# Patient Record
Sex: Male | Born: 1950 | Race: White | Hispanic: No | Marital: Married | Smoking: Never smoker
Health system: Southern US, Community
[De-identification: ages and names within clinical notes are randomized; demographics above are authoritative.]

## PROBLEM LIST (undated history)

## (undated) DIAGNOSIS — R011 Cardiac murmur, unspecified: Secondary | ICD-10-CM

## (undated) DIAGNOSIS — N400 Enlarged prostate without lower urinary tract symptoms: Secondary | ICD-10-CM

## (undated) DIAGNOSIS — R109 Unspecified abdominal pain: Secondary | ICD-10-CM

## (undated) DIAGNOSIS — D510 Vitamin B12 deficiency anemia due to intrinsic factor deficiency: Secondary | ICD-10-CM

## (undated) DIAGNOSIS — I10 Essential (primary) hypertension: Secondary | ICD-10-CM

## (undated) DIAGNOSIS — E119 Type 2 diabetes mellitus without complications: Secondary | ICD-10-CM

## (undated) DIAGNOSIS — R630 Anorexia: Secondary | ICD-10-CM

## (undated) DIAGNOSIS — E7800 Pure hypercholesterolemia, unspecified: Secondary | ICD-10-CM

## (undated) DIAGNOSIS — M1990 Unspecified osteoarthritis, unspecified site: Secondary | ICD-10-CM

## (undated) DIAGNOSIS — R110 Nausea: Secondary | ICD-10-CM

## (undated) DIAGNOSIS — N183 Chronic kidney disease, stage 3 unspecified: Secondary | ICD-10-CM

## (undated) DIAGNOSIS — N200 Calculus of kidney: Secondary | ICD-10-CM

## (undated) DIAGNOSIS — Z87442 Personal history of urinary calculi: Secondary | ICD-10-CM

## (undated) HISTORY — PX: WISDOM TOOTH EXTRACTION: SHX21

## (undated) HISTORY — PX: CYSTOSCOPY W/ URETEROSCOPY W/ LITHOTRIPSY: SUR380

## (undated) HISTORY — DX: Pure hypercholesterolemia, unspecified: E78.00

## (undated) HISTORY — DX: Chronic kidney disease, stage 3 unspecified: N18.30

## (undated) HISTORY — DX: Essential (primary) hypertension: I10

## (undated) HISTORY — DX: Unspecified abdominal pain: R10.9

## (undated) HISTORY — DX: Unspecified osteoarthritis, unspecified site: M19.90

## (undated) HISTORY — DX: Nausea: R11.0

## (undated) HISTORY — DX: Calculus of kidney: N20.0

## (undated) HISTORY — DX: Benign prostatic hyperplasia without lower urinary tract symptoms: N40.0

## (undated) HISTORY — DX: Vitamin B12 deficiency anemia due to intrinsic factor deficiency: D51.0

## (undated) HISTORY — DX: Anorexia: R63.0

## (undated) HISTORY — DX: Type 2 diabetes mellitus without complications: E11.9

## (undated) HISTORY — PX: COLONOSCOPY: SHX174

---

## 2009-10-28 ENCOUNTER — Encounter: Admission: RE | Admit: 2009-10-28 | Discharge: 2009-10-28 | Payer: Self-pay | Admitting: Specialist

## 2013-07-21 DIAGNOSIS — N200 Calculus of kidney: Secondary | ICD-10-CM

## 2013-07-21 HISTORY — DX: Calculus of kidney: N20.0

## 2014-05-25 DIAGNOSIS — H35353 Cystoid macular degeneration, bilateral: Secondary | ICD-10-CM | POA: Diagnosis not present

## 2014-05-25 DIAGNOSIS — E1139 Type 2 diabetes mellitus with other diabetic ophthalmic complication: Secondary | ICD-10-CM | POA: Diagnosis not present

## 2014-06-25 DIAGNOSIS — Z0000 Encounter for general adult medical examination without abnormal findings: Secondary | ICD-10-CM | POA: Diagnosis not present

## 2014-06-25 DIAGNOSIS — Z6828 Body mass index (BMI) 28.0-28.9, adult: Secondary | ICD-10-CM | POA: Diagnosis not present

## 2014-10-06 DIAGNOSIS — E08311 Diabetes mellitus due to underlying condition with unspecified diabetic retinopathy with macular edema: Secondary | ICD-10-CM | POA: Diagnosis not present

## 2014-10-21 DIAGNOSIS — E119 Type 2 diabetes mellitus without complications: Secondary | ICD-10-CM | POA: Diagnosis not present

## 2014-10-21 DIAGNOSIS — E1165 Type 2 diabetes mellitus with hyperglycemia: Secondary | ICD-10-CM | POA: Diagnosis not present

## 2014-10-21 DIAGNOSIS — I10 Essential (primary) hypertension: Secondary | ICD-10-CM | POA: Diagnosis not present

## 2014-10-21 DIAGNOSIS — Z6828 Body mass index (BMI) 28.0-28.9, adult: Secondary | ICD-10-CM | POA: Diagnosis not present

## 2014-10-21 DIAGNOSIS — Z125 Encounter for screening for malignant neoplasm of prostate: Secondary | ICD-10-CM | POA: Diagnosis not present

## 2014-12-02 DIAGNOSIS — E113313 Type 2 diabetes mellitus with moderate nonproliferative diabetic retinopathy with macular edema, bilateral: Secondary | ICD-10-CM | POA: Diagnosis not present

## 2014-12-16 DIAGNOSIS — E113313 Type 2 diabetes mellitus with moderate nonproliferative diabetic retinopathy with macular edema, bilateral: Secondary | ICD-10-CM | POA: Diagnosis not present

## 2015-01-06 DIAGNOSIS — E113311 Type 2 diabetes mellitus with moderate nonproliferative diabetic retinopathy with macular edema, right eye: Secondary | ICD-10-CM | POA: Diagnosis not present

## 2015-01-18 DIAGNOSIS — E1165 Type 2 diabetes mellitus with hyperglycemia: Secondary | ICD-10-CM | POA: Diagnosis not present

## 2015-01-18 DIAGNOSIS — Z6828 Body mass index (BMI) 28.0-28.9, adult: Secondary | ICD-10-CM | POA: Diagnosis not present

## 2015-01-18 DIAGNOSIS — I10 Essential (primary) hypertension: Secondary | ICD-10-CM | POA: Diagnosis not present

## 2015-01-20 DIAGNOSIS — E113312 Type 2 diabetes mellitus with moderate nonproliferative diabetic retinopathy with macular edema, left eye: Secondary | ICD-10-CM | POA: Diagnosis not present

## 2015-02-17 DIAGNOSIS — E113311 Type 2 diabetes mellitus with moderate nonproliferative diabetic retinopathy with macular edema, right eye: Secondary | ICD-10-CM | POA: Diagnosis not present

## 2015-03-03 DIAGNOSIS — E113312 Type 2 diabetes mellitus with moderate nonproliferative diabetic retinopathy with macular edema, left eye: Secondary | ICD-10-CM | POA: Diagnosis not present

## 2015-04-07 DIAGNOSIS — E113311 Type 2 diabetes mellitus with moderate nonproliferative diabetic retinopathy with macular edema, right eye: Secondary | ICD-10-CM | POA: Diagnosis not present

## 2015-05-03 DIAGNOSIS — I10 Essential (primary) hypertension: Secondary | ICD-10-CM | POA: Diagnosis not present

## 2015-05-03 DIAGNOSIS — E785 Hyperlipidemia, unspecified: Secondary | ICD-10-CM | POA: Diagnosis not present

## 2015-05-03 DIAGNOSIS — E119 Type 2 diabetes mellitus without complications: Secondary | ICD-10-CM | POA: Diagnosis not present

## 2015-05-03 DIAGNOSIS — Z6829 Body mass index (BMI) 29.0-29.9, adult: Secondary | ICD-10-CM | POA: Diagnosis not present

## 2015-05-19 DIAGNOSIS — E113311 Type 2 diabetes mellitus with moderate nonproliferative diabetic retinopathy with macular edema, right eye: Secondary | ICD-10-CM | POA: Diagnosis not present

## 2015-06-09 DIAGNOSIS — E113311 Type 2 diabetes mellitus with moderate nonproliferative diabetic retinopathy with macular edema, right eye: Secondary | ICD-10-CM | POA: Diagnosis not present

## 2015-07-07 DIAGNOSIS — E113311 Type 2 diabetes mellitus with moderate nonproliferative diabetic retinopathy with macular edema, right eye: Secondary | ICD-10-CM | POA: Diagnosis not present

## 2015-08-10 DIAGNOSIS — T63481A Toxic effect of venom of other arthropod, accidental (unintentional), initial encounter: Secondary | ICD-10-CM | POA: Diagnosis not present

## 2015-08-11 DIAGNOSIS — E113311 Type 2 diabetes mellitus with moderate nonproliferative diabetic retinopathy with macular edema, right eye: Secondary | ICD-10-CM | POA: Diagnosis not present

## 2015-08-31 DIAGNOSIS — E119 Type 2 diabetes mellitus without complications: Secondary | ICD-10-CM | POA: Diagnosis not present

## 2015-08-31 DIAGNOSIS — Z6828 Body mass index (BMI) 28.0-28.9, adult: Secondary | ICD-10-CM | POA: Diagnosis not present

## 2015-08-31 DIAGNOSIS — E11319 Type 2 diabetes mellitus with unspecified diabetic retinopathy without macular edema: Secondary | ICD-10-CM | POA: Diagnosis not present

## 2015-08-31 DIAGNOSIS — E785 Hyperlipidemia, unspecified: Secondary | ICD-10-CM | POA: Diagnosis not present

## 2015-08-31 DIAGNOSIS — I10 Essential (primary) hypertension: Secondary | ICD-10-CM | POA: Diagnosis not present

## 2015-08-31 DIAGNOSIS — Z125 Encounter for screening for malignant neoplasm of prostate: Secondary | ICD-10-CM | POA: Diagnosis not present

## 2015-09-22 DIAGNOSIS — E113311 Type 2 diabetes mellitus with moderate nonproliferative diabetic retinopathy with macular edema, right eye: Secondary | ICD-10-CM | POA: Diagnosis not present

## 2015-10-13 DIAGNOSIS — Z6828 Body mass index (BMI) 28.0-28.9, adult: Secondary | ICD-10-CM | POA: Diagnosis not present

## 2015-10-13 DIAGNOSIS — J019 Acute sinusitis, unspecified: Secondary | ICD-10-CM | POA: Diagnosis not present

## 2015-10-20 DIAGNOSIS — E113311 Type 2 diabetes mellitus with moderate nonproliferative diabetic retinopathy with macular edema, right eye: Secondary | ICD-10-CM | POA: Diagnosis not present

## 2015-11-03 DIAGNOSIS — Z6828 Body mass index (BMI) 28.0-28.9, adult: Secondary | ICD-10-CM | POA: Diagnosis not present

## 2015-11-03 DIAGNOSIS — T63481A Toxic effect of venom of other arthropod, accidental (unintentional), initial encounter: Secondary | ICD-10-CM | POA: Diagnosis not present

## 2015-11-17 DIAGNOSIS — E113393 Type 2 diabetes mellitus with moderate nonproliferative diabetic retinopathy without macular edema, bilateral: Secondary | ICD-10-CM | POA: Diagnosis not present

## 2015-11-28 DIAGNOSIS — B349 Viral infection, unspecified: Secondary | ICD-10-CM | POA: Diagnosis not present

## 2015-11-28 DIAGNOSIS — Z6827 Body mass index (BMI) 27.0-27.9, adult: Secondary | ICD-10-CM | POA: Diagnosis not present

## 2015-12-01 DIAGNOSIS — B349 Viral infection, unspecified: Secondary | ICD-10-CM | POA: Diagnosis not present

## 2015-12-01 DIAGNOSIS — Z6827 Body mass index (BMI) 27.0-27.9, adult: Secondary | ICD-10-CM | POA: Diagnosis not present

## 2016-01-10 DIAGNOSIS — E785 Hyperlipidemia, unspecified: Secondary | ICD-10-CM | POA: Diagnosis not present

## 2016-01-10 DIAGNOSIS — Z6827 Body mass index (BMI) 27.0-27.9, adult: Secondary | ICD-10-CM | POA: Diagnosis not present

## 2016-01-10 DIAGNOSIS — I10 Essential (primary) hypertension: Secondary | ICD-10-CM | POA: Diagnosis not present

## 2016-01-10 DIAGNOSIS — E1165 Type 2 diabetes mellitus with hyperglycemia: Secondary | ICD-10-CM | POA: Diagnosis not present

## 2016-01-12 DIAGNOSIS — E113393 Type 2 diabetes mellitus with moderate nonproliferative diabetic retinopathy without macular edema, bilateral: Secondary | ICD-10-CM | POA: Diagnosis not present

## 2016-03-09 DIAGNOSIS — E119 Type 2 diabetes mellitus without complications: Secondary | ICD-10-CM | POA: Diagnosis not present

## 2016-03-09 DIAGNOSIS — H25811 Combined forms of age-related cataract, right eye: Secondary | ICD-10-CM | POA: Diagnosis not present

## 2016-04-11 DIAGNOSIS — Z125 Encounter for screening for malignant neoplasm of prostate: Secondary | ICD-10-CM | POA: Diagnosis not present

## 2016-04-11 DIAGNOSIS — E1165 Type 2 diabetes mellitus with hyperglycemia: Secondary | ICD-10-CM | POA: Diagnosis not present

## 2016-04-11 DIAGNOSIS — E119 Type 2 diabetes mellitus without complications: Secondary | ICD-10-CM | POA: Diagnosis not present

## 2016-04-11 DIAGNOSIS — E785 Hyperlipidemia, unspecified: Secondary | ICD-10-CM | POA: Diagnosis not present

## 2016-04-11 DIAGNOSIS — E11319 Type 2 diabetes mellitus with unspecified diabetic retinopathy without macular edema: Secondary | ICD-10-CM | POA: Diagnosis not present

## 2016-04-11 DIAGNOSIS — I10 Essential (primary) hypertension: Secondary | ICD-10-CM | POA: Diagnosis not present

## 2016-04-12 DIAGNOSIS — L821 Other seborrheic keratosis: Secondary | ICD-10-CM | POA: Diagnosis not present

## 2016-04-12 DIAGNOSIS — C44622 Squamous cell carcinoma of skin of right upper limb, including shoulder: Secondary | ICD-10-CM | POA: Diagnosis not present

## 2016-04-12 DIAGNOSIS — D1801 Hemangioma of skin and subcutaneous tissue: Secondary | ICD-10-CM | POA: Diagnosis not present

## 2016-04-12 DIAGNOSIS — L578 Other skin changes due to chronic exposure to nonionizing radiation: Secondary | ICD-10-CM | POA: Diagnosis not present

## 2016-04-12 DIAGNOSIS — D225 Melanocytic nevi of trunk: Secondary | ICD-10-CM | POA: Diagnosis not present

## 2016-04-12 DIAGNOSIS — L570 Actinic keratosis: Secondary | ICD-10-CM | POA: Diagnosis not present

## 2016-05-08 DIAGNOSIS — E119 Type 2 diabetes mellitus without complications: Secondary | ICD-10-CM | POA: Diagnosis not present

## 2016-05-15 DIAGNOSIS — Z79899 Other long term (current) drug therapy: Secondary | ICD-10-CM | POA: Diagnosis not present

## 2016-05-15 DIAGNOSIS — I10 Essential (primary) hypertension: Secondary | ICD-10-CM | POA: Diagnosis not present

## 2016-05-15 DIAGNOSIS — E119 Type 2 diabetes mellitus without complications: Secondary | ICD-10-CM | POA: Diagnosis not present

## 2016-05-15 DIAGNOSIS — Z7984 Long term (current) use of oral hypoglycemic drugs: Secondary | ICD-10-CM | POA: Diagnosis not present

## 2016-05-15 DIAGNOSIS — H25811 Combined forms of age-related cataract, right eye: Secondary | ICD-10-CM | POA: Diagnosis not present

## 2016-05-15 HISTORY — PX: CATARACT EXTRACTION: SUR2

## 2016-06-20 DIAGNOSIS — H35351 Cystoid macular degeneration, right eye: Secondary | ICD-10-CM | POA: Diagnosis not present

## 2016-06-26 DIAGNOSIS — E113311 Type 2 diabetes mellitus with moderate nonproliferative diabetic retinopathy with macular edema, right eye: Secondary | ICD-10-CM | POA: Diagnosis not present

## 2016-07-12 DIAGNOSIS — E113393 Type 2 diabetes mellitus with moderate nonproliferative diabetic retinopathy without macular edema, bilateral: Secondary | ICD-10-CM | POA: Diagnosis not present

## 2016-08-28 DIAGNOSIS — L570 Actinic keratosis: Secondary | ICD-10-CM | POA: Diagnosis not present

## 2016-08-28 DIAGNOSIS — B009 Herpesviral infection, unspecified: Secondary | ICD-10-CM | POA: Diagnosis not present

## 2016-08-30 DIAGNOSIS — H35351 Cystoid macular degeneration, right eye: Secondary | ICD-10-CM | POA: Diagnosis not present

## 2016-09-06 DIAGNOSIS — E785 Hyperlipidemia, unspecified: Secondary | ICD-10-CM | POA: Diagnosis not present

## 2016-09-06 DIAGNOSIS — I10 Essential (primary) hypertension: Secondary | ICD-10-CM | POA: Diagnosis not present

## 2016-09-06 DIAGNOSIS — Z6828 Body mass index (BMI) 28.0-28.9, adult: Secondary | ICD-10-CM | POA: Diagnosis not present

## 2016-09-06 DIAGNOSIS — E119 Type 2 diabetes mellitus without complications: Secondary | ICD-10-CM | POA: Diagnosis not present

## 2016-10-04 DIAGNOSIS — H35351 Cystoid macular degeneration, right eye: Secondary | ICD-10-CM | POA: Diagnosis not present

## 2016-10-16 DIAGNOSIS — Z1211 Encounter for screening for malignant neoplasm of colon: Secondary | ICD-10-CM | POA: Diagnosis not present

## 2016-11-07 DIAGNOSIS — K621 Rectal polyp: Secondary | ICD-10-CM | POA: Diagnosis not present

## 2016-11-07 DIAGNOSIS — K5730 Diverticulosis of large intestine without perforation or abscess without bleeding: Secondary | ICD-10-CM | POA: Diagnosis not present

## 2016-11-07 DIAGNOSIS — K648 Other hemorrhoids: Secondary | ICD-10-CM | POA: Diagnosis not present

## 2016-11-07 DIAGNOSIS — E7800 Pure hypercholesterolemia, unspecified: Secondary | ICD-10-CM | POA: Diagnosis not present

## 2016-11-07 DIAGNOSIS — Z79899 Other long term (current) drug therapy: Secondary | ICD-10-CM | POA: Diagnosis not present

## 2016-11-07 DIAGNOSIS — D127 Benign neoplasm of rectosigmoid junction: Secondary | ICD-10-CM | POA: Diagnosis not present

## 2016-11-07 DIAGNOSIS — E119 Type 2 diabetes mellitus without complications: Secondary | ICD-10-CM | POA: Diagnosis not present

## 2016-11-07 DIAGNOSIS — D122 Benign neoplasm of ascending colon: Secondary | ICD-10-CM | POA: Diagnosis not present

## 2016-11-07 DIAGNOSIS — Z86010 Personal history of colonic polyps: Secondary | ICD-10-CM | POA: Diagnosis not present

## 2016-11-07 DIAGNOSIS — Z1211 Encounter for screening for malignant neoplasm of colon: Secondary | ICD-10-CM | POA: Diagnosis not present

## 2016-11-15 DIAGNOSIS — E113311 Type 2 diabetes mellitus with moderate nonproliferative diabetic retinopathy with macular edema, right eye: Secondary | ICD-10-CM | POA: Diagnosis not present

## 2016-12-20 DIAGNOSIS — E785 Hyperlipidemia, unspecified: Secondary | ICD-10-CM | POA: Diagnosis not present

## 2016-12-20 DIAGNOSIS — I10 Essential (primary) hypertension: Secondary | ICD-10-CM | POA: Diagnosis not present

## 2016-12-20 DIAGNOSIS — Z6827 Body mass index (BMI) 27.0-27.9, adult: Secondary | ICD-10-CM | POA: Diagnosis not present

## 2016-12-20 DIAGNOSIS — E119 Type 2 diabetes mellitus without complications: Secondary | ICD-10-CM | POA: Diagnosis not present

## 2017-01-03 DIAGNOSIS — E113311 Type 2 diabetes mellitus with moderate nonproliferative diabetic retinopathy with macular edema, right eye: Secondary | ICD-10-CM | POA: Diagnosis not present

## 2020-12-08 ENCOUNTER — Encounter: Payer: Self-pay | Admitting: Gastroenterology

## 2021-01-09 ENCOUNTER — Other Ambulatory Visit (INDEPENDENT_AMBULATORY_CARE_PROVIDER_SITE_OTHER): Payer: Medicare Other

## 2021-01-09 ENCOUNTER — Encounter: Payer: Self-pay | Admitting: Gastroenterology

## 2021-01-09 ENCOUNTER — Other Ambulatory Visit: Payer: Self-pay

## 2021-01-09 ENCOUNTER — Ambulatory Visit (INDEPENDENT_AMBULATORY_CARE_PROVIDER_SITE_OTHER): Payer: Medicare Other | Admitting: Gastroenterology

## 2021-01-09 VITALS — BP 144/82 | HR 98 | Ht 64.0 in | Wt 156.0 lb

## 2021-01-09 DIAGNOSIS — Z860100 Personal history of colon polyps, unspecified: Secondary | ICD-10-CM

## 2021-01-09 DIAGNOSIS — K760 Fatty (change of) liver, not elsewhere classified: Secondary | ICD-10-CM

## 2021-01-09 DIAGNOSIS — R9389 Abnormal findings on diagnostic imaging of other specified body structures: Secondary | ICD-10-CM | POA: Diagnosis not present

## 2021-01-09 LAB — COMPREHENSIVE METABOLIC PANEL
ALT: 18 U/L (ref 0–53)
AST: 13 U/L (ref 0–37)
Albumin: 4.5 g/dL (ref 3.5–5.2)
Alkaline Phosphatase: 79 U/L (ref 39–117)
BUN: 22 mg/dL (ref 6–23)
CO2: 29 mEq/L (ref 19–32)
Calcium: 9.8 mg/dL (ref 8.4–10.5)
Chloride: 103 mEq/L (ref 96–112)
Creatinine, Ser: 1.07 mg/dL (ref 0.40–1.50)
GFR: 69.77 mL/min (ref >60.00)
Glucose, Bld: 172 mg/dL — ABNORMAL HIGH (ref 70–99)
Potassium: 4.2 mEq/L (ref 3.5–5.1)
Sodium: 138 mEq/L (ref 135–145)
Total Bilirubin: 0.5 mg/dL (ref 0.2–1.2)
Total Protein: 7 g/dL (ref 6.0–8.3)

## 2021-01-09 LAB — CBC WITH DIFFERENTIAL/PLATELET
Basophils Absolute: 0 10*3/uL (ref 0.0–0.1)
Basophils Relative: 0.4 % (ref 0.0–3.0)
Eosinophils Absolute: 0.1 10*3/uL (ref 0.0–0.7)
Eosinophils Relative: 1.7 % (ref 0.0–5.0)
HCT: 41.8 % (ref 39.0–52.0)
Hemoglobin: 13.9 g/dL (ref 13.0–17.0)
Lymphocytes Relative: 18.2 % (ref 12.0–46.0)
Lymphs Abs: 1 10*3/uL (ref 0.7–4.0)
MCHC: 33.3 g/dL (ref 30.0–36.0)
MCV: 88.1 fl (ref 78.0–100.0)
Monocytes Absolute: 0.5 10*3/uL (ref 0.1–1.0)
Monocytes Relative: 9.9 % (ref 3.0–12.0)
Neutro Abs: 3.9 10*3/uL (ref 1.4–7.7)
Neutrophils Relative %: 69.8 % (ref 43.0–77.0)
Platelets: 216 10*3/uL (ref 150.0–400.0)
RBC: 4.74 Mil/uL (ref 4.22–5.81)
RDW: 13.8 % (ref 11.5–15.5)
WBC: 5.5 10*3/uL (ref 4.0–10.5)

## 2021-01-09 NOTE — Progress Notes (Signed)
Chief Complaint:   Referring Provider:  No ref. provider found      ASSESSMENT AND PLAN;   #1. H/O polyps.  #2. Fatty liver with ?early liver cirrhosis. Nl plts and alb in past.  #3. Abn MRI 11/2020 showing ?1.8 cm liver lesion  Plan: -CBC, CMP, AFP, PT INR -Colon with 2 day prep Jan/feb 2023 -Rpt MRI liver with and without contrast Jan 2023 (as suggested by radiology) -Encouraged him to continue losing weight gradually   Discussed risks & benefits of colonoscopy. Risks including rare perforation req laparotomy, bleeding after bx/polypectomy req blood transfusion, rarely missing neoplasms, risks of anesthesia/sedation, rare risk of damage to internal organs. Benefits outweigh the risks. Patient agrees to proceed. All the questions were answered. Pt consents to proceed. HPI:    Ronald Flores is a 70 y.o. male  H/O recurrent kidney stones, DM2, HTN, HLD  Here for colonoscopy and abnormal MRI  Underwent MRI abdomen with and without contrast November 30, 2020 (by urology)-showing 1.8 x 1.0 cm liver lesion without washout.  Underlying marked hepatic steatosis.  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  Recommended to repeat MRI in 3 months or liver biopsy if needed.  No nausea, vomiting, heartburn, regurgitation, odynophagia or dysphagia.  No significant diarrhea or constipation.  No melena or hematochezia. No unintentional weight loss. No abdominal pain.  Lost wt 163 to 156 intentionally  No H/O itching, skin lesions, easy bruisability, intake of OTC meds including diet pills, herbal medications, anabolic steroids or Tylenol. There is no H/O blood transfusions, IVDA or FH of liver disease. No jaundice, dark urine or pale stools. No alcohol abuse.  68 Brother died recently d/t liver Ca 2066/04/29, [name])  Previous GI work-up: Colonoscopy 11/07/2016 ([facility])-colonic polyps s/p polypectomy, mild sigmoid diverticulosis. Bx-tubular adenomas. Rpt 3  yrs Past Medical History:  Diagnosis Date   Abdominal pain    DM (diabetes mellitus) ([place])    High blood pressure    Hypercholesteremia    Kidney stones    Loss of appetite    Nausea      Family History  Problem Relation Age of Onset   Breast cancer Mother    Liver cancer Brother     Social History   Tobacco Use   Smoking status: Never   Smokeless tobacco: Never  Substance Use Topics   Alcohol use: Never   Drug use: Never    Current Outpatient Medications  Medication Sig Dispense Refill   Exenatide ER 2 MG PEN Inject into the skin.     hydrALAZINE (APRESOLINE) 25 MG tablet Take 25 mg by mouth 3 (three) times daily.     lisinopril (ZESTRIL) 20 MG tablet Take by mouth.     metFORMIN (GLUCOPHAGE) 1000 MG tablet Take by mouth.     No current facility-administered medications for this visit.    No Known Allergies  Review of Systems:  Constitutional: Denies fever, chills, diaphoresis, appetite change and fatigue.  HEENT: Denies photophobia, eye pain, redness, hearing loss, ear pain, congestion, sore throat, rhinorrhea, sneezing, mouth sores, neck pain, neck stiffness and tinnitus.   Respiratory: Denies SOB, DOE, cough, chest tightness,  and wheezing.   Cardiovascular: Denies chest pain, palpitations and leg swelling.  Genitourinary: Denies dysuria, urgency, frequency, hematuria, flank pain and difficulty urinating.  Musculoskeletal: Denies myalgias, back pain, joint swelling, arthralgias and gait problem.  Skin: No rash.  Neurological: Denies dizziness, seizures, syncope, weakness, light-headedness, numbness and headaches.  Hematological:  Denies adenopathy. Easy bruising, personal or family bleeding history  Psychiatric/Behavioral: No anxiety or depression     Physical Exam:    BP (!) 144/82 (BP Location: Right Arm, Patient Position: Sitting, Cuff Size: Normal)   Pulse 98   Ht 5\' 4"  (1.626 m)   Wt 156 lb (70.8 kg)   SpO2 (!) 77%   BMI 26.78 kg/m  Wt Readings  from Last 3 Encounters:  01/09/21 156 lb (70.8 kg)   Constitutional:  Well-developed, in no acute distress. Psychiatric: Normal mood and affect. Behavior is normal. HEENT: Pupils normal.  Conjunctivae are normal. No scleral icterus. Neck supple.  Cardiovascular: Normal rate, regular rhythm. No edema Pulmonary/chest: Effort normal and breath sounds normal. No wheezing, rales or rhonchi. Abdominal: Soft, nondistended. Nontender. Bowel sounds active throughout. There are no masses palpable.  Liver palpated 2 cm below the costal margin. Rectal: Deferred Neurological: Alert and oriented to person place and time. Skin: Skin is warm and dry. No rashes noted.  Data Reviewed: I have personally reviewed following labs and imaging s   Radiology Studies: No results found.    Carmell Austria, MD 01/09/2021, 1:56 PM  Cc: No ref. provider found

## 2021-01-09 NOTE — Patient Instructions (Signed)
If you are age 71 or older, your body mass index should be between 23-30. Your Body mass index is 26.78 kg/m. If this is out of the aforementioned range listed, please consider follow up with your Primary Care Provider.  If you are age 57 or younger, your body mass index should be between 19-25. Your Body mass index is 26.78 kg/m. If this is out of the aformentioned range listed, please consider follow up with your Primary Care Provider.   ________________________________________________________  The [place] [facility] providers would like to encourage you to use [facility] to communicate with providers for non-urgent requests or questions.  Due to long hold times on the telephone, sending your provider a message by [facility] may be a faster and more efficient way to get a response.  Please allow 48 business hours for a response.  Please remember that this is for non-urgent requests.  _______________________________________________________  Please go to the lab on the 2nd floor [address] before you leave the office today.   You have been scheduled for a colonoscopy. Please follow written instructions given to you at your visit today.  Please pick up your prep supplies at the pharmacy within the next 1-3 days. If you use inhalers (even only as needed), please bring them with you on the day of your procedure.  Two days before your procedure: Mix 3 packs (or capfuls) of Miralax in 48 ounces of clear liquid and drink at 6pm.   You have been scheduled for an MRI at [facility] on 02-07-2021. Your appointment time is 10am. Please arrive to admitting (at main entrance of the hospital) 30 minutes prior to your appointment time for registration purposes. Please make certain not to have anything to eat or drink 6 hours prior to your test. In addition, if you have any metal in your body, have a pacemaker or defibrillator, please be sure to let your ordering physician know. This test typically takes 45 minutes to  1 hour to complete. Should you need to reschedule, please call [phone] to do so.  Please call with any questions or concerns.  Thank you,  Dr. Jackquline Denmark

## 2021-01-10 LAB — AFP TUMOR MARKER: AFP-Tumor Marker: 2.8 ng/mL (ref <6.1)

## 2021-01-11 LAB — PROTIME-INR
INR: 0.9 ratio (ref 0.8–1.0)
Prothrombin Time: 10.3 s (ref 9.6–13.1)

## 2021-02-07 ENCOUNTER — Other Ambulatory Visit: Payer: Self-pay

## 2021-02-07 ENCOUNTER — Ambulatory Visit (HOSPITAL_COMMUNITY)
Admission: RE | Admit: 2021-02-07 | Discharge: 2021-02-07 | Disposition: A | Discharge status: Home / Self Care | Payer: Medicare Other | Source: Ambulatory Visit | Attending: Gastroenterology | Admitting: Gastroenterology

## 2021-02-07 DIAGNOSIS — R9389 Abnormal findings on diagnostic imaging of other specified body structures: Secondary | ICD-10-CM | POA: Insufficient documentation

## 2021-02-07 DIAGNOSIS — K760 Fatty (change of) liver, not elsewhere classified: Secondary | ICD-10-CM | POA: Insufficient documentation

## 2021-02-07 MED ORDER — GADOBUTROL 1 MMOL/ML IV SOLN
7.0000 mL | Freq: Once | INTRAVENOUS | Status: AC | PRN
  Administered 2021-02-07: 7 mL via INTRAVENOUS

## 2021-02-08 ENCOUNTER — Telehealth: Payer: Self-pay | Admitting: Gastroenterology

## 2021-02-08 NOTE — Telephone Encounter (Signed)
Inbound call from patient's wife stating he had an appt with his PCP where they went ahead and gave him his results for the MRI he had done yesterday and would like to talk with the nurse to further discuss.

## 2021-02-09 NOTE — Telephone Encounter (Signed)
Patient's wife [name]  states that the pt  had an appt with his PCP where they went ahead and gave him his results for the MRI he had done recently. [name] states that pt PCP requested that pt reach out to Dr. Lyndel Safe for further recommendations Please Advise

## 2021-02-15 ENCOUNTER — Other Ambulatory Visit: Payer: Self-pay

## 2021-02-15 DIAGNOSIS — K769 Liver disease, unspecified: Secondary | ICD-10-CM

## 2021-02-15 DIAGNOSIS — R7889 Finding of other specified substances, not normally found in blood: Secondary | ICD-10-CM

## 2021-02-15 DIAGNOSIS — Z800 Family history of malignant neoplasm of digestive organs: Secondary | ICD-10-CM

## 2021-02-15 DIAGNOSIS — R9389 Abnormal findings on diagnostic imaging of other specified body structures: Secondary | ICD-10-CM

## 2021-02-15 DIAGNOSIS — K7460 Unspecified cirrhosis of liver: Secondary | ICD-10-CM

## 2021-02-15 DIAGNOSIS — K760 Fatty (change of) liver, not elsewhere classified: Secondary | ICD-10-CM

## 2021-02-15 NOTE — Telephone Encounter (Signed)
Labs placed into Epic: Pt wife [name] made aware. Given Location to lab. IR consult for possible liver Bx entered into Epic. [name] made aware that they will be contacting them: Pt scheduled for a Follow Up appointment with Dr. Lyndel Safe on 03/03/2021 at 9:30 in [facility]. [name] made aware.  [name] verbalized understanding with all questions answered

## 2021-02-16 ENCOUNTER — Other Ambulatory Visit (INDEPENDENT_AMBULATORY_CARE_PROVIDER_SITE_OTHER): Payer: Medicare Other

## 2021-02-16 DIAGNOSIS — K7460 Unspecified cirrhosis of liver: Secondary | ICD-10-CM

## 2021-02-16 DIAGNOSIS — R7889 Finding of other specified substances, not normally found in blood: Secondary | ICD-10-CM

## 2021-02-16 DIAGNOSIS — K760 Fatty (change of) liver, not elsewhere classified: Secondary | ICD-10-CM

## 2021-02-16 DIAGNOSIS — Z800 Family history of malignant neoplasm of digestive organs: Secondary | ICD-10-CM

## 2021-02-16 DIAGNOSIS — R9389 Abnormal findings on diagnostic imaging of other specified body structures: Secondary | ICD-10-CM | POA: Diagnosis not present

## 2021-02-16 DIAGNOSIS — K769 Liver disease, unspecified: Secondary | ICD-10-CM

## 2021-02-16 LAB — PROTIME-INR
INR: 0.9 ratio (ref 0.8–1.0)
Prothrombin Time: 10.2 s (ref 9.6–13.1)

## 2021-02-16 LAB — COMPREHENSIVE METABOLIC PANEL
ALT: 17 U/L (ref 0–53)
AST: 12 U/L (ref 0–37)
Albumin: 4.4 g/dL (ref 3.5–5.2)
Alkaline Phosphatase: 66 U/L (ref 39–117)
BUN: 19 mg/dL (ref 6–23)
CO2: 29 mEq/L (ref 19–32)
Calcium: 9.4 mg/dL (ref 8.4–10.5)
Chloride: 101 mEq/L (ref 96–112)
Creatinine, Ser: 0.93 mg/dL (ref 0.40–1.50)
GFR: 82.5 mL/min (ref >60.00)
Glucose, Bld: 194 mg/dL — ABNORMAL HIGH (ref 70–99)
Potassium: 4.2 mEq/L (ref 3.5–5.1)
Sodium: 137 mEq/L (ref 135–145)
Total Bilirubin: 0.6 mg/dL (ref 0.2–1.2)
Total Protein: 6.9 g/dL (ref 6.0–8.3)

## 2021-02-16 LAB — CBC WITH DIFFERENTIAL/PLATELET
Basophils Absolute: 0 10*3/uL (ref 0.0–0.1)
Basophils Relative: 0.3 % (ref 0.0–3.0)
Eosinophils Absolute: 0.1 10*3/uL (ref 0.0–0.7)
Eosinophils Relative: 1.7 % (ref 0.0–5.0)
HCT: 41 % (ref 39.0–52.0)
Hemoglobin: 13.6 g/dL (ref 13.0–17.0)
Lymphocytes Relative: 17.4 % (ref 12.0–46.0)
Lymphs Abs: 0.9 10*3/uL (ref 0.7–4.0)
MCHC: 33.2 g/dL (ref 30.0–36.0)
MCV: 88.8 fl (ref 78.0–100.0)
Monocytes Absolute: 0.5 10*3/uL (ref 0.1–1.0)
Monocytes Relative: 8.9 % (ref 3.0–12.0)
Neutro Abs: 3.8 10*3/uL (ref 1.4–7.7)
Neutrophils Relative %: 71.7 % (ref 43.0–77.0)
Platelets: 218 10*3/uL (ref 150.0–400.0)
RBC: 4.62 Mil/uL (ref 4.22–5.81)
RDW: 13.5 % (ref 11.5–15.5)
WBC: 5.3 10*3/uL (ref 4.0–10.5)

## 2021-02-17 LAB — CEA: CEA: 2.2 ng/mL

## 2021-02-17 LAB — AFP TUMOR MARKER: AFP-Tumor Marker: 2.6 ng/mL (ref <6.1)

## 2021-02-18 LAB — CA 19-9 (SERIAL): CA 19-9: 18 U/mL (ref 0–35)

## 2021-02-19 NOTE — Progress Notes (Signed)
Please inform the patient. All results normal or at baseline. Remo Lipps, can you please follow-up on IR consult.  Do we have a date? Send report to family physician

## 2021-02-21 ENCOUNTER — Encounter (HOSPITAL_COMMUNITY): Payer: Self-pay

## 2021-02-21 NOTE — Progress Notes (Signed)
Patient Name  Ronald Flores, Ronald Flores Legal Sex  Male DOB  [birth year] SSN  [id number] Address  [address] [place] [address] Phone  [phone] [facility])  [phone] (Work)  [phone] (Mobile) *Preferred*    RE: US BIOPSY (LIVER) Received: Today Arne Cleveland, MD  Sherlyn Lees   US guided core bx liver lesion WITH CONTRAST  See MR Im 32 Se 19\   DDH        Previous Messages   ----- Message -----  From: Valli Glance  Sent: 02/21/2021   2:17 PM EST  To: Ir Procedure Requests  Subject: US BIOPSY (LIVER)                               US BIOPSY (LIVER)      Reason: Liver lesion, right lobe       History:MRI in computer       Provider: Gupta, [place]

## 2021-02-24 ENCOUNTER — Other Ambulatory Visit (HOSPITAL_COMMUNITY): Payer: Self-pay | Admitting: Physician Assistant

## 2021-02-27 ENCOUNTER — Encounter (HOSPITAL_COMMUNITY): Payer: Self-pay

## 2021-02-27 ENCOUNTER — Ambulatory Visit (HOSPITAL_COMMUNITY)
Admission: RE | Admit: 2021-02-27 | Discharge: 2021-02-27 | Disposition: A | Discharge status: Home / Self Care | Payer: Medicare Other | Source: Ambulatory Visit | Attending: Gastroenterology | Admitting: Gastroenterology

## 2021-02-27 ENCOUNTER — Other Ambulatory Visit: Payer: Self-pay

## 2021-02-27 ENCOUNTER — Other Ambulatory Visit: Payer: Self-pay | Admitting: Radiology

## 2021-02-27 ENCOUNTER — Other Ambulatory Visit (HOSPITAL_COMMUNITY): Payer: Self-pay | Admitting: Physician Assistant

## 2021-02-27 ENCOUNTER — Other Ambulatory Visit (HOSPITAL_COMMUNITY): Payer: Medicare Other

## 2021-02-27 DIAGNOSIS — E7800 Pure hypercholesterolemia, unspecified: Secondary | ICD-10-CM | POA: Insufficient documentation

## 2021-02-27 DIAGNOSIS — K769 Liver disease, unspecified: Secondary | ICD-10-CM | POA: Insufficient documentation

## 2021-02-27 DIAGNOSIS — C220 Liver cell carcinoma: Secondary | ICD-10-CM | POA: Diagnosis not present

## 2021-02-27 DIAGNOSIS — R9389 Abnormal findings on diagnostic imaging of other specified body structures: Secondary | ICD-10-CM | POA: Diagnosis present

## 2021-02-27 DIAGNOSIS — I10 Essential (primary) hypertension: Secondary | ICD-10-CM | POA: Diagnosis not present

## 2021-02-27 DIAGNOSIS — Z808 Family history of malignant neoplasm of other organs or systems: Secondary | ICD-10-CM | POA: Diagnosis not present

## 2021-02-27 DIAGNOSIS — K760 Fatty (change of) liver, not elsewhere classified: Secondary | ICD-10-CM | POA: Insufficient documentation

## 2021-02-27 DIAGNOSIS — Z87442 Personal history of urinary calculi: Secondary | ICD-10-CM | POA: Diagnosis not present

## 2021-02-27 DIAGNOSIS — E118 Type 2 diabetes mellitus with unspecified complications: Secondary | ICD-10-CM | POA: Insufficient documentation

## 2021-02-27 LAB — CBC
HCT: 43 % (ref 39.0–52.0)
Hemoglobin: 14.7 g/dL (ref 13.0–17.0)
MCH: 30.6 pg (ref 26.0–34.0)
MCHC: 34.2 g/dL (ref 30.0–36.0)
MCV: 89.6 fL (ref 80.0–100.0)
Platelets: 212 10*3/uL (ref 150–400)
RBC: 4.8 MIL/uL (ref 4.22–5.81)
RDW: 13.1 % (ref 11.5–15.5)
WBC: 5.4 10*3/uL (ref 4.0–10.5)
nRBC: 0 % (ref 0.0–0.2)

## 2021-02-27 LAB — GLUCOSE, CAPILLARY: Glucose-Capillary: 204 mg/dL — ABNORMAL HIGH (ref 70–99)

## 2021-02-27 LAB — PROTIME-INR
INR: 0.9 (ref 0.8–1.2)
Prothrombin Time: 12.1 seconds (ref 11.4–15.2)

## 2021-02-27 MED ORDER — SODIUM CHLORIDE 0.9 % IV SOLN: INTRAVENOUS | Status: DC

## 2021-02-27 MED ORDER — MIDAZOLAM HCL 2 MG/2ML IJ SOLN
INTRAMUSCULAR | Status: AC
  Filled 2021-02-27: qty 2

## 2021-02-27 MED ORDER — MIDAZOLAM HCL 2 MG/2ML IJ SOLN
INTRAMUSCULAR | Status: AC | PRN
Start: 2021-02-27 — End: 2021-02-27
  Administered 2021-02-27: 1 mg via INTRAVENOUS

## 2021-02-27 MED ORDER — GELATIN ABSORBABLE 12-7 MM EX MISC
CUTANEOUS | Status: AC
  Administered 2021-02-27: 1
  Filled 2021-02-27: qty 1

## 2021-02-27 MED ORDER — FENTANYL CITRATE (PF) 100 MCG/2ML IJ SOLN
INTRAMUSCULAR | Status: AC
  Filled 2021-02-27: qty 2

## 2021-02-27 MED ORDER — FENTANYL CITRATE (PF) 100 MCG/2ML IJ SOLN
INTRAMUSCULAR | Status: AC | PRN
  Administered 2021-02-27: 50 ug via INTRAVENOUS

## 2021-02-27 MED ORDER — LIDOCAINE HCL 1 % IJ SOLN
INTRAMUSCULAR | Status: AC
  Administered 2021-02-27: 10 mL
  Filled 2021-02-27: qty 20

## 2021-02-27 NOTE — Consult Note (Signed)
Chief Complaint: Patient was seen in consultation today for  image guided liver lesion biopsy  Referring Physician(s): [place]  Supervising Physician: Corrie Mckusick  Patient Status: [facility] - Out[facility]  History of Present Illness: Ronald Flores is a 71 y.o. male with past medical history of diabetes, fatty liver, hypertension, hypercholesterolemia, nephrolithiasis and family history(1/2 brother) of liver cancer.  He underwent an MRI of the abdomen in October of last year to evaluate renal stones and this incidentally revealed a 1.8 cm right liver lesion, marked hepatic steatosis and portal hepatic venous shunting.  Follow-up MRI on 02/07/2021 revealed:   1. The lesion of concern centrally in the right hepatic lobe (at the border of segments V and VIII) appears slightly larger since the recent MRI of 10 weeks ago. This lesion demonstrates arterial phase enhancement, restricted diffusion and contrast washout, findings which are suspicious for hepatocellular carcinoma, especially given the patient's family history. If the patient has a history of cirrhosis, this could be categorized as a LI-RADS category 5 lesion. If personal risk factors for cirrhosis/liver disease are unclear, consider tissue sampling to confirm. 2. Inferior to the lesion, there is an area of portal shunting which appears unchanged. 3. Diffuse hepatic steatosis.  AFP, CEA and CA 19-9 are within normal limits.  He presents today for image guided liver lesion biopsy for further evaluation.  He  does report weight loss.  Past Medical History:  Diagnosis Date   Abdominal pain    DM (diabetes mellitus) ([place])    High blood pressure    Hypercholesteremia    Kidney stones    Loss of appetite    Nausea     Past Surgical History:  Procedure Laterality Date   CATARACT EXTRACTION  05/15/2016   COLONOSCOPY     Colonic polyp status post polypectomg. Mild sigmoid diverticulosis     Allergies: Patient has no known  allergies.  Medications: Prior to Admission medications   Medication Sig Start Date End Date Taking? Authorizing Provider  Exenatide ER 2 MG PEN Inject into the skin.    [provider]  hydrALAZINE (APRESOLINE) 25 MG tablet Take 25 mg by mouth 3 (three) times daily. 11/17/20   [provider]  lisinopril (ZESTRIL) 20 MG tablet Take by mouth.    [provider]  metFORMIN (GLUCOPHAGE) 1000 MG tablet Take by mouth.    [provider]     Family History  Problem Relation Age of Onset   Breast cancer Mother    Liver cancer Brother     Social History   Socioeconomic History   Marital status: Unknown    Spouse name: Not on file   Number of children: Not on file   Years of education: Not on file   Highest education level: Not on file  Occupational History   Not on file  Tobacco Use   Smoking status: Never   Smokeless tobacco: Never  Substance and Sexual Activity   Alcohol use: Never   Drug use: Never   Sexual activity: Not on file  Other Topics Concern   Not on file  Social History Narrative   Not on file   Social Determinants of Health   Financial Resource Strain: Not on file  Food Insecurity: Not on file  Transportation Needs: Not on file  Physical Activity: Not on file  Stress: Not on file  Social Connections: Not on file      Review of Systems denies fever, headache, chest pain, dyspnea, cough, abdominal/back pain,  nausea, vomiting or bleeding.  Positive for weight loss.  Vital Signs: BP (!) 203/97    Pulse 73    Temp 97.8 F (36.6 C) (Oral)    Resp 18    SpO2 100%   Physical Exam awake, alert.  Chest clear to auscultation bilaterally.  Heart with regular rate and rhythm.  Abdomen soft, positive bowel sounds, nontender.  No lower extremity edema.  Imaging: MR LIVER W WO CONTRAST  Result Date: 02/07/2021 CLINICAL DATA:  Indeterminate liver lesion. Family history of liver cancer. EXAM: MRI ABDOMEN WITHOUT AND WITH CONTRAST  TECHNIQUE: Multiplanar multisequence MR imaging of the abdomen was performed both before and after the administration of intravenous contrast. CONTRAST:  41mL GADAVIST GADOBUTROL 1 MMOL/ML IV SOLN COMPARISON:  Abdominal MRI 11/30/2020 and 03/10/2009. Abdominopelvic CT 11/23/2020. FINDINGS: Lower chest:  The visualized lower chest appears unremarkable. Hepatobiliary: Again demonstrated is diffuse hepatic steatosis with loss of signal on the gradient echo opposed phase images. There are no morphologic changes of cirrhosis. The lesion of concern in segment V/VIII demonstrates mild T2 hyperintensity, restricted diffusion and intense arterial phase enhancement. It now measures 1.9 x 1.4 cm on image 32/19 (most recently 1.5 x 1.3 cm). This lesion does demonstrate non peripheral washout on the delayed phase images. An area of focal portal shunting inferolateral to this lesion is unchanged, accounting for a 1.7 x 0.8 cm enhancing lesion on image 36/20. Chronic lesion peripherally in the dome of the right hepatic lobe shows low T1 and low T2 signal, no enhancement and is stable from 2011, consistent with a benign finding. No evidence of gallstones, gallbladder wall thickening or biliary dilatation. Pancreas: Unremarkable. No pancreatic ductal dilatation or surrounding inflammatory changes. Spleen: Normal in size without focal abnormality. Adrenals/Urinary Tract: Both adrenal glands appear normal. Both kidneys appear unremarkable, without hydronephrosis. Stomach/Bowel: The stomach appears unremarkable for its degree of distension. No evidence of bowel wall thickening, distention or surrounding inflammatory change. Vascular/Lymphatic: There are no enlarged abdominal lymph nodes. No other significant vascular findings aside from the portal shunting in the right hepatic lobe. No evidence of portal vein tumor. Other: No ascites. Musculoskeletal: No acute or significant osseous findings. IMPRESSION: 1. The lesion of concern  centrally in the right hepatic lobe (at the border of segments V and VIII) appears slightly larger since the recent MRI of 10 weeks ago. This lesion demonstrates arterial phase enhancement, restricted diffusion and contrast washout, findings which are suspicious for hepatocellular carcinoma, especially given the patient's family history. If the patient has a history of cirrhosis, this could be categorized as a LI-RADS category 5 lesion. If personal risk factors for cirrhosis/liver disease are unclear, consider tissue sampling to confirm. 2. Inferior to the lesion, there is an area of portal shunting which appears unchanged. 3. Diffuse hepatic steatosis. Electronically Signed   By: Richardean Sale M.D.   On: 02/07/2021 17:05    Labs:  CBC: Recent Labs    01/09/21 1443 02/16/21 1459 02/27/21 1123  WBC 5.5 5.3 5.4  HGB 13.9 13.6 14.7  HCT 41.8 41.0 43.0  PLT 216.0 218.0 212    COAGS: Recent Labs    01/09/21 1443 02/16/21 1459  INR 0.9 0.9    BMP: Recent Labs    01/09/21 1443 02/16/21 1459  NA 138 137  K 4.2 4.2  CL 103 101  CO2 29 29  GLUCOSE 172* 194*  BUN 22 19  CALCIUM 9.8 9.4  CREATININE 1.07 0.93    LIVER FUNCTION TESTS: Recent  Labs    01/09/21 1443 02/16/21 1459  BILITOT 0.5 0.6  AST 13 12  ALT 18 17  ALKPHOS 79 66  PROT 7.0 6.9  ALBUMIN 4.5 4.4    TUMOR MARKERS: Recent Labs    01/09/21 1443 02/16/21 1459  AFPTM 2.8 2.6  CEA  --  2.2    Assessment and Plan: 71 y.o. male with past medical history of diabetes, fatty liver, hypertension, hypercholesterolemia, nephrolithiasis and family history(1/2 brother) of liver cancer.  He underwent an MRI of the abdomen in October of last year to evaluate renal stones and this incidentally revealed a 1.8 cm right liver lesion, marked hepatic steatosis and portal hepatic venous shunting.  Follow-up MRI on 02/07/2021 revealed:   1. The lesion of concern centrally in the right hepatic lobe (at the border of segments V  and VIII) appears slightly larger since the recent MRI of 10 weeks ago. This lesion demonstrates arterial phase enhancement, restricted diffusion and contrast washout, findings which are suspicious for hepatocellular carcinoma, especially given the patient's family history. If the patient has a history of cirrhosis, this could be categorized as a LI-RADS category 5 lesion. If personal risk factors for cirrhosis/liver disease are unclear, consider tissue sampling to confirm. 2. Inferior to the lesion, there is an area of portal shunting which appears unchanged. 3. Diffuse hepatic steatosis.  AFP, CEA and CA 19-9 are within normal limits.  He presents today for image guided liver lesion biopsy for further evaluation.  He  does report weight loss.Risks and benefits of procedure was discussed with the patient  including, but not limited to bleeding, infection, damage to adjacent structures or low yield requiring additional tests.  All of the questions were answered and there is agreement to proceed.  Consent signed and in chart.    Thank you for this interesting consult.  I greatly enjoyed meeting Joon Pohle and look forward to participating in their care.  A copy of this report was sent to the requesting provider on this date.  Electronically Signed: D. Rowe Robert, PA-C 02/27/2021, 11:37 AM   I spent a total of   25 minutes  in face to face in clinical consultation, greater than 50% of which was counseling/coordinating care for image guided liver lesion biopsy

## 2021-02-27 NOTE — Procedures (Signed)
Interventional Radiology Procedure Note  Procedure: US guided biopsy of liver mass, right liver, concern for malignancy Complications: None EBL: None Recommendations: - Bedrest 2 hours.   - Routine wound care - Follow up pathology - Advance diet   Signed,  Corrie Mckusick, DO

## 2021-02-27 NOTE — Discharge Instructions (Addendum)
Interventional radiology phone numbers [phone] After hours [phone]  Liver Biopsy, Care After These instructions give you information on caring for yourself after your procedure. Your doctor may also give you more specific instructions. Call your doctor if you have any problems or questions after your procedure. What can I expect after the procedure? After the procedure, it is common to have: Pain and soreness where the biopsy was done. Bruising around the area where the biopsy was done. Sleepiness and be tired for a few days. Follow these instructions at home: Medicines Take over-the-counter and prescription medicines only as told by your doctor. If you were prescribed an antibiotic medicine, take it as told by your doctor. Do not stop taking the antibiotic even if you start to feel better. Do not take medicines such as aspirin and ibuprofen. These medicines can thin your blood. Do not take these medicines unless your doctor tells you to take them. If you are taking prescription pain medicine, take actions to prevent or treat constipation. Your doctor may recommend that you: Drink enough fluid to keep your pee (urine) clear or pale yellow. Take over-the-counter or prescription medicines. Eat foods that are high in fiber, such as fresh fruits and vegetables, whole grains, and beans. Limit foods that are high in fat and processed sugars, such as fried and sweet foods. Caring for your cut Follow instructions from your doctor about how to take care of your cuts from surgery (incisions). Make sure you: Wash your hands with soap and water before you change your bandage (dressing). If you cannot use soap and water, use hand sanitizer. Change your bandage as told by your doctor. Check your cuts every day for signs of infection. Check for: Redness, swelling, or more pain. Fluid or blood. Pus or a bad smell. Warmth. Do not take baths, swim, or use a hot tub until your doctor says it is  okay to do so. You may remove your dressing tomorrow and shower. Activity Rest at home for 1-2 days or as told by your doctor. Avoid sitting for a long time without moving. Get up to take short walks every 1-2 hours. Return to your normal activities as told by your doctor. Ask what activities are safe for you. Do not do these things in the first 24 hours: Drive. Use machinery. Take a bath or shower. Do not lift more than 10 pounds (4.5 kg) or play contact sports for the first 2 weeks.   General instructions Do not drink alcohol in the first week after the procedure. Have someone stay with you for at least 24 hours after the procedure. Get your test results. Ask your doctor or the department that is doing the test: When will my results be ready? How will I get my results? What are my treatment options? What other tests do I need? What are my next steps? Keep all follow-up visits as told by your doctor. This is important.   Contact a doctor if: A cut bleeds and leaves more than just a small spot of blood. A cut is red, puffs up (swells), or hurts more than before. Fluid or something else comes from a cut. A cut smells bad. You have a fever or chills. Get help right away if: You have swelling, bloating, or pain in your belly (abdomen). You get dizzy or faint. You have a rash. You feel sick to your stomach (nauseous) or throw up (vomit). You have trouble breathing, feel short of breath, or feel faint. Your chest   hurts. You have problems talking or seeing. You have trouble with your balance or moving your arms or legs. Summary After the procedure, it is common to have pain, soreness, bruising, and tiredness. Your doctor will tell you how to take care of yourself at home. Change your bandage, take your medicines, and limit your activities as told by your doctor. Call your doctor if you have symptoms of infection. Get help right away if your belly swells, your cut bleeds a lot, or you  have trouble talking or breathing. This information is not intended to replace advice given to you by your health care provider. Make sure you discuss any questions you have with your health care provider. Document Revised: 01/31/2017 Document Reviewed: 02/01/2017 Elsevier Patient Education  2021 Elsevier Inc.     Moderate Conscious Sedation, Adult, Care After This sheet gives you information about how to care for yourself after your procedure. Your health care provider may also give you more specific instructions. If you have problems or questions, contact your health care provider. What can I expect after the procedure? After the procedure, it is common to have: Sleepiness for several hours. Impaired judgment for several hours. Difficulty with balance. Vomiting if you eat too soon. Follow these instructions at home: For the time period you were told by your health care provider: Rest. Do not participate in activities where you could fall or become injured. Do not drive or use machinery. Do not drink alcohol. Do not take sleeping pills or medicines that cause drowsiness. Do not make important decisions or sign legal documents. Do not take care of children on your own.     Eating and drinking Follow the diet recommended by your health care provider. Drink enough fluid to keep your urine pale yellow. If you vomit: Drink water, juice, or soup when you can drink without vomiting. Make sure you have little or no nausea before eating solid foods.   General instructions Take over-the-counter and prescription medicines only as told by your health care provider. Have a responsible adult stay with you for the time you are told. It is important to have someone help care for you until you are awake and alert. Do not smoke. Keep all follow-up visits as told by your health care provider. This is important. Contact a health care provider if: You are still sleepy or having trouble with  balance after 24 hours. You feel light-headed. You keep feeling nauseous or you keep vomiting. You develop a rash. You have a fever. You have redness or swelling around the IV site. Get help right away if: You have trouble breathing. You have new-onset confusion at home. Summary After the procedure, it is common to feel sleepy, have impaired judgment, or feel nauseous if you eat too soon. Rest after you get home. Know the things you should not do after the procedure. Follow the diet recommended by your health care provider and drink enough fluid to keep your urine pale yellow. Get help right away if you have trouble breathing or new-onset confusion at home. This information is not intended to replace advice given to you by your health care provider. Make sure you discuss any questions you have with your health care provider. Document Revised: 05/22/2019 Document Reviewed: 12/18/2018 Elsevier Patient Education  2021 Elsevier Inc.  

## 2021-03-01 LAB — SURGICAL PATHOLOGY

## 2021-03-03 ENCOUNTER — Encounter: Payer: Self-pay | Admitting: Gastroenterology

## 2021-03-03 ENCOUNTER — Other Ambulatory Visit: Payer: Self-pay | Admitting: Gastroenterology

## 2021-03-03 ENCOUNTER — Telehealth: Payer: Self-pay | Admitting: Oncology

## 2021-03-03 ENCOUNTER — Other Ambulatory Visit: Payer: Self-pay

## 2021-03-03 ENCOUNTER — Ambulatory Visit (INDEPENDENT_AMBULATORY_CARE_PROVIDER_SITE_OTHER): Payer: Medicare Other | Admitting: Gastroenterology

## 2021-03-03 VITALS — BP 160/74 | HR 85 | Ht 64.0 in | Wt 158.0 lb

## 2021-03-03 DIAGNOSIS — C220 Liver cell carcinoma: Secondary | ICD-10-CM

## 2021-03-03 DIAGNOSIS — C229 Malignant neoplasm of liver, not specified as primary or secondary: Secondary | ICD-10-CM

## 2021-03-03 NOTE — Progress Notes (Signed)
Chief Complaint: FU  Referring Provider:  [facility], [facility]*      ASSESSMENT AND PLAN;   #1. [place]. Underlying fatty liver but no cirrhosis. No ETOH. Nl AFP. Not a candidate for transplant d/t advanced age, comorbidities.   Plan: -Appt with Dr McCarty/Dr Bobby Rumpf ([place]) -IR consultation. -Hold off on colon for now. -Will discuss in conference (if candidate for resection) -He would get acute hepatitis profile at [facility] HPI:    Ronald Flores is a 71 y.o. male  H/O recurrent kidney stones, DM2, HTN, HLD  Here to discuss biopsy results. Liver Bx-showing well-differentiated HCC  Underwent MRI abdomen with and without contrast November 30, 2020 (by urology)-showing 1.8 x 1.0 cm liver lesion (segment V/VII) with marked hepatic steatosis.  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 FU MRI Jan 2023 showed mild interval growth 1.9 into 1.4 cm.  No underlying cirrhosis. Underwent IR guided biopsy showing well-differentiated HCC  AFP is normal  No nausea, vomiting, heartburn, regurgitation, odynophagia or dysphagia.  No significant diarrhea or constipation.  No melena or hematochezia. No unintentional weight loss. No abdominal pain.  Lost wt 163 to 156 intentionally  Wt Readings from Last 3 Encounters:  03/03/21 158 lb (71.7 kg)  01/09/21 156 lb (70.8 kg)    No H/O itching, skin lesions, easy bruisability, intake of OTC meds including diet pills, herbal medications, anabolic steroids or Tylenol. There is no H/O blood transfusions, IVDA. No jaundice, dark urine or pale stools. No alcohol abuse.  80 Brother died recently d/t liver Ca 05-13[birth year]66, [name])  Previous GI work-up: Colonoscopy 11/07/2016 ([facility])-colonic polyps s/p polypectomy, mild sigmoid diverticulosis. Bx-tubular adenomas. Rpt 3 yrs Past Medical History:  Diagnosis Date   Abdominal pain    DM (diabetes mellitus) ([place])    High blood pressure    Hypercholesteremia    Kidney stones    Loss of  appetite    Nausea      Family History  Problem Relation Age of Onset   Breast cancer Mother    Liver cancer Brother    Colon cancer Neg Hx    Esophageal cancer Neg Hx     Social History   Tobacco Use   Smoking status: Never   Smokeless tobacco: Never  Vaping Use   Vaping Use: Never used  Substance Use Topics   Alcohol use: Never   Drug use: Never    Current Outpatient Medications  Medication Sig Dispense Refill   Exenatide ER 2 MG PEN Inject into the skin.     hydrALAZINE (APRESOLINE) 25 MG tablet Take 25 mg by mouth 2 (two) times daily.     lisinopril (ZESTRIL) 20 MG tablet Take by mouth.     metFORMIN (GLUCOPHAGE) 1000 MG tablet Take by mouth.     tamsulosin (FLOMAX) 0.4 MG CAPS capsule 1 capsule daily in the afternoon.     No current facility-administered medications for this visit.    No Known Allergies  Review of Systems:  Constitutional: Denies fever, chills, diaphoresis, appetite change and fatigue.  HEENT: Denies photophobia, eye pain, redness, hearing loss, ear pain, congestion, sore throat, rhinorrhea, sneezing, mouth sores, neck pain, neck stiffness and tinnitus.   Respiratory: Denies SOB, DOE, cough, chest tightness,  and wheezing.   Cardiovascular: Denies chest pain, palpitations and leg swelling.  Genitourinary: Denies dysuria, urgency, frequency, hematuria, flank pain and difficulty urinating.  Musculoskeletal: Denies myalgias, back pain, joint swelling, arthralgias and gait problem.  Skin: No  rash.  Neurological: Denies dizziness, seizures, syncope, weakness, light-headedness, numbness and headaches.  Hematological: Denies adenopathy. Easy bruising, personal or family bleeding history  Psychiatric/Behavioral: No anxiety or depression     Physical Exam:    BP (!) 160/74    Pulse 85    Ht 5\' 4"  (1.626 m)    Wt 158 lb (71.7 kg)    BMI 27.12 kg/m  Wt Readings from Last 3 Encounters:  03/03/21 158 lb (71.7 kg)  01/09/21 156 lb (70.8 kg)    Constitutional:  Well-developed, in no acute distress. Psychiatric: Normal mood and affect. Behavior is normal. HEENT: Pupils normal.  Conjunctivae are normal. No scleral icterus. Neck supple.  Cardiovascular: Normal rate, regular rhythm. No edema Pulmonary/chest: Effort normal and breath sounds normal. No wheezing, rales or rhonchi. Abdominal: Soft, nondistended. Nontender. Bowel sounds active throughout. There are no masses palpable.  Liver palpated 2 cm below the costal margin. Rectal: Deferred Neurological: Alert and oriented to person place and time. Skin: Skin is warm and dry. No rashes noted.  Data Reviewed: I have personally reviewed following labs and imaging s   Radiology Studies: MR LIVER W WO CONTRAST  Result Date: 02/07/2021 CLINICAL DATA:  Indeterminate liver lesion. Family history of liver cancer. EXAM: MRI ABDOMEN WITHOUT AND WITH CONTRAST TECHNIQUE: Multiplanar multisequence MR imaging of the abdomen was performed both before and after the administration of intravenous contrast. CONTRAST:  1mL GADAVIST GADOBUTROL 1 MMOL/ML IV SOLN COMPARISON:  Abdominal MRI 11/30/2020 and 03/10/2009. Abdominopelvic CT 11/23/2020. FINDINGS: Lower chest:  The visualized lower chest appears unremarkable. Hepatobiliary: Again demonstrated is diffuse hepatic steatosis with loss of signal on the gradient echo opposed phase images. There are no morphologic changes of cirrhosis. The lesion of concern in segment V/VIII demonstrates mild T2 hyperintensity, restricted diffusion and intense arterial phase enhancement. It now measures 1.9 x 1.4 cm on image 32/19 (most recently 1.5 x 1.3 cm). This lesion does demonstrate non peripheral washout on the delayed phase images. An area of focal portal shunting inferolateral to this lesion is unchanged, accounting for a 1.7 x 0.8 cm enhancing lesion on image 36/20. Chronic lesion peripherally in the dome of the right hepatic lobe shows low T1 and low T2 signal, no  enhancement and is stable from 2011, consistent with a benign finding. No evidence of gallstones, gallbladder wall thickening or biliary dilatation. Pancreas: Unremarkable. No pancreatic ductal dilatation or surrounding inflammatory changes. Spleen: Normal in size without focal abnormality. Adrenals/Urinary Tract: Both adrenal glands appear normal. Both kidneys appear unremarkable, without hydronephrosis. Stomach/Bowel: The stomach appears unremarkable for its degree of distension. No evidence of bowel wall thickening, distention or surrounding inflammatory change. Vascular/Lymphatic: There are no enlarged abdominal lymph nodes. No other significant vascular findings aside from the portal shunting in the right hepatic lobe. No evidence of portal vein tumor. Other: No ascites. Musculoskeletal: No acute or significant osseous findings. IMPRESSION: 1. The lesion of concern centrally in the right hepatic lobe (at the border of segments V and VIII) appears slightly larger since the recent MRI of 10 weeks ago. This lesion demonstrates arterial phase enhancement, restricted diffusion and contrast washout, findings which are suspicious for hepatocellular carcinoma, especially given the patient's family history. If the patient has a history of cirrhosis, this could be categorized as a LI-RADS category 5 lesion. If personal risk factors for cirrhosis/liver disease are unclear, consider tissue sampling to confirm. 2. Inferior to the lesion, there is an area of portal shunting which appears unchanged. 3. Diffuse  hepatic steatosis. Electronically Signed   By: Richardean Sale M.D.   On: 02/07/2021 17:05   US BIOPSY (LIVER)  Result Date: 02/27/2021 INDICATION: 71 year old male referred for biopsy of right liver mass EXAM: ULTRASOUND-GUIDED BIOPSY RIGHT LIVER MASS MEDICATIONS: None. ANESTHESIA/SEDATION: Moderate (conscious) sedation was employed during this procedure. A total of Versed 2.0 mg and Fentanyl 100 mcg was  administered intravenously by the radiology nurse. Total intra-service moderate Sedation Time: 10 minutes. The patient's level of consciousness and vital signs were monitored continuously by radiology nursing throughout the procedure under my direct supervision. FLUOROSCOPY TIME:  None COMPLICATIONS: None PROCEDURE: Informed written consent was obtained from the patient after a thorough discussion of the procedural risks, benefits and alternatives. All questions were addressed. Maximal Sterile Barrier Technique was utilized including caps, mask, sterile gowns, sterile gloves, sterile drape, hand hygiene and skin antiseptic. A timeout was performed prior to the initiation of the procedure. Ultrasound survey of the right liver lobe performed with images stored and sent to PACs. The right lower thorax/right upper abdomen was prepped with chlorhexidine in a sterile fashion, and a sterile drape was applied covering the operative field. A sterile gown and sterile gloves were used for the procedure. Local anesthesia was provided with 1% Lidocaine. The patient was prepped and draped sterilely and the skin and subcutaneous tissues were generously infiltrated with 1% lidocaine. A 17 gauge introducer needle was then advanced under ultrasound guidance in an intercostal location into the right liver lobe, targeting hypoechoic mass of the right liver. The stylet was removed, and multiple separate 18 gauge core biopsy were retrieved. Samples were placed into formalin for transportation to the lab. Gel-Foam pledgets were then infused with a small amount of saline for assistance with hemostasis. The needle was removed, and a final ultrasound image was performed. The patient tolerated the procedure well and remained hemodynamically stable throughout. No complications were encountered and no significant blood loss was encounter. IMPRESSION: Status post ultrasound-guided biopsy of right liver mass. Signed, Dulcy Fanny. Dellia Nims, RPVI  [facility] Radiology Electronically Signed   By: Corrie Mckusick D.O.   On: 02/27/2021 13:48      Carmell Austria, MD 03/03/2021, 9:36 AM  Cc: [facility]*

## 2021-03-03 NOTE — Telephone Encounter (Signed)
Patient has been scheduled for 03/06/21. Pt's spouse aware of appt date,time, location and instructions.

## 2021-03-03 NOTE — Patient Instructions (Addendum)
If you are age 72 or older, your body mass index should be between 23-30. Your Body mass index is 27.12 kg/m. If this is out of the aforementioned range listed, please consider follow up with your Primary Care Provider.  __________________________________________________________  The [facility] providers would like to encourage you to use [facility] to communicate with providers for non-urgent requests or questions.  Due to long hold times on the telephone, sending your provider a message by [facility] may be a faster and more efficient way to get a response.  Please allow 48 business hours for a response.  Please remember that this is for non-urgent requests.    Due to recent changes in healthcare laws, you may see the results of your imaging and laboratory studies on MyChart before your provider has had a chance to review them.  We understand that in some cases there may be results that are confusing or concerning to you. Not all laboratory results come back in the same time frame and the provider may be waiting for multiple results in order to interpret others.  Please give [place] 48 hours in order for your provider to thoroughly review all the results before contacting the office for clarification of your results.    A referral has been made for the cancer center in [place]. If you haven't heard anything in 3 business days please call [phone] Please call [phone] in 2 weeks regarding your IR consult  It was a pleasure to see you today!  Jackquline Denmark, M.D.6

## 2021-03-05 ENCOUNTER — Other Ambulatory Visit: Payer: Self-pay | Admitting: Oncology

## 2021-03-05 DIAGNOSIS — C220 Liver cell carcinoma: Secondary | ICD-10-CM | POA: Insufficient documentation

## 2021-03-05 HISTORY — DX: Liver cell carcinoma: C22.0

## 2021-03-05 NOTE — Progress Notes (Signed)
[facility]  [address] [place],  [place]  [address] [phone]  Clinic Day:  03/06/2021  Referring physician: [facility]   HISTORY OF PRESENT ILLNESS:  The patient is a 71 y.o. male  who I was asked to consult upon for newly diagnosed hepatocellular carcinoma.  His history dates back to October 2022 when he underwent a CT scan to evaluate for hematuria in the setting of kidney stones.  This study unexpectedly showed a lesion on his right liver.  This led to an MRI being done a week later, which showed the same right hepatic lesion measuring 1.4 cm.  When this lesion was reassessed in December 2022, the lesion had grown to 1.9 cm.  Based upon such growth in a short period of time, the patient underwent a biopsy of the lesion in question, whose pathology came back consistent with well-differentiated hepatocellular carcinoma.  He comes in today to go over his biopsy and scan results, as well as their implications.  The patient denies ever having right upper quadrant pain, early satiety, jaundice, or other symptoms which ever alerted him to hepatocellular carcinoma being present.  The patient is somewhat nervous about this diagnosis, particularly as his brother recently succumbed from liver cancer.  There is no history of either cirrhosis or hepatitis C with this patient.    PAST MEDICAL HISTORY:   Past Medical History:  Diagnosis Date   Abdominal pain    DM (diabetes mellitus) ([place])    High blood pressure    Hypercholesteremia    Kidney stones    Loss of appetite    Nausea     PAST SURGICAL HISTORY:   Past Surgical History:  Procedure Laterality Date   CATARACT EXTRACTION  05/15/2016   COLONOSCOPY     Colonic polyp status post polypectomg. Mild sigmoid diverticulosis    CURRENT MEDICATIONS:   Current Outpatient Medications  Medication Sig Dispense Refill   Exenatide ER 2 MG PEN Inject into the skin.     hydrALAZINE  (APRESOLINE) 25 MG tablet Take 25 mg by mouth 2 (two) times daily.     lisinopril (ZESTRIL) 20 MG tablet Take by mouth.     metFORMIN (GLUCOPHAGE) 1000 MG tablet Take by mouth.     tamsulosin (FLOMAX) 0.4 MG CAPS capsule 1 capsule daily in the afternoon.     No current facility-administered medications for this visit.    ALLERGIES:  No Known Allergies  FAMILY HISTORY:   Family History  Problem Relation Age of Onset   Breast cancer Mother    Liver cancer Brother    Colon cancer Neg Hx    Esophageal cancer Neg Hx     SOCIAL HISTORY:  The patient was born and raised in [place].  He now lives [place] with his wife of 20 years.  He has 2 children, multiple grandchildren and great-grandchildren.  He does apartment remodeling work.  There is no history of alcoholism or tobacco abuse.  REVIEW OF SYSTEMS:  Review of Systems  Constitutional:  Negative for fatigue, fever and unexpected weight change.  HENT:   Positive for hearing loss.   Respiratory:  Negative for chest tightness, cough, hemoptysis and shortness of breath.   Cardiovascular:  Negative for chest pain and palpitations.  Gastrointestinal:  Negative for abdominal distention, abdominal pain, blood in stool, constipation, diarrhea, nausea and vomiting.  Genitourinary:  Negative for dysuria, frequency and hematuria.   Musculoskeletal:  Negative for  arthralgias, back pain and myalgias.  Skin:  Negative for itching and rash.  Neurological:  Negative for dizziness, headaches and light-headedness.  Psychiatric/Behavioral:  Negative for depression and suicidal ideas. The patient is not nervous/anxious.     PHYSICAL EXAM:  Blood pressure (!) 168/80, pulse 99, temperature 97.7 F (36.5 C), resp. rate 18, height 5\' 4"  (1.626 m), weight 154 lb 3.2 oz (69.9 kg), SpO2 97 %. Wt Readings from Last 3 Encounters:  03/06/21 154 lb 3.2 oz (69.9 kg)  03/03/21 158 lb (71.7 kg)  01/09/21 156 lb (70.8 kg)   Body mass index is  26.47 kg/m. Performance status (ECOG): 1 - Symptomatic but completely ambulatory Physical Exam Constitutional:      Appearance: Normal appearance. He is not ill-appearing.  HENT:     Mouth/Throat:     Mouth: Mucous membranes are moist.     Pharynx: Oropharynx is clear. No oropharyngeal exudate or posterior oropharyngeal erythema.  Cardiovascular:     Rate and Rhythm: Normal rate and regular rhythm.     Heart sounds: No murmur heard.   No friction rub. No gallop.  Pulmonary:     Effort: Pulmonary effort is normal. No respiratory distress.     Breath sounds: Normal breath sounds. No wheezing, rhonchi or rales.  Abdominal:     General: Bowel sounds are normal. There is no distension.     Palpations: Abdomen is soft. There is no mass.     Tenderness: There is no abdominal tenderness.  Musculoskeletal:        General: No swelling.     Right lower leg: No edema.     Left lower leg: No edema.  Lymphadenopathy:     Cervical: No cervical adenopathy.     Upper Body:     Right upper body: No supraclavicular or axillary adenopathy.     Left upper body: No supraclavicular or axillary adenopathy.     Lower Body: No right inguinal adenopathy. No left inguinal adenopathy.  Skin:    General: Skin is warm.     Coloration: Skin is not jaundiced.     Findings: No lesion or rash.  Neurological:     General: No focal deficit present.     Mental Status: He is alert and oriented to person, place, and time. Mental status is at baseline.  Psychiatric:        Mood and Affect: Mood normal.        Behavior: Behavior normal.        Thought Content: Thought content normal.    LABS:   CBC Latest Ref Rng & Units 03/06/2021 02/27/2021 02/16/2021  WBC - 6.9 5.4 5.3  Hemoglobin 13.5 - 17.5 15.2 14.7 13.6  Hematocrit 41 - 53 45 43.0 41.0  Platelets 150 - 399 222 212 218.0   CMP Latest Ref Rng & Units 03/06/2021 02/16/2021 01/09/2021  Glucose 70 - 99 mg/dL - 194(H) 172(H)  BUN 4 - 21 22(A) 19 22   Creatinine 0.6 - 1.3 1.0 0.93 1.07  Sodium 137 - 147 138 137 138  Potassium 3.4 - 5.3 4.0 4.2 4.2  Chloride 99 - 108 100 101 103  CO2 13 - 22 29(A) 29 29  Calcium 8.7 - 10.7 9.6 9.4 9.8  Total Protein 6.0 - 8.3 g/dL - 6.9 7.0  Total Bilirubin 0.2 - 1.2 mg/dL - 0.6 0.5  Alkaline Phos 25 - 125 95 66 79  AST 14 - 40 25 12 13   ALT 10 - 40  29 17 18     Latest Reference Range & Units 02/16/21 14:59  AFP Tumor Marker <6.1 ng/mL 2.6   STUDIES:  MR LIVER W WO CONTRAST  Result Date: 02/07/2021 CLINICAL DATA:  Indeterminate liver lesion. Family history of liver cancer. EXAM: MRI ABDOMEN WITHOUT AND WITH CONTRAST TECHNIQUE: Multiplanar multisequence MR imaging of the abdomen was performed both before and after the administration of intravenous contrast. CONTRAST:  7mL GADAVIST GADOBUTROL 1 MMOL/ML IV SOLN COMPARISON:  Abdominal MRI 11/30/2020 and 03/10/2009. Abdominopelvic CT 11/23/2020. FINDINGS: Lower chest:  The visualized lower chest appears unremarkable. Hepatobiliary: Again demonstrated is diffuse hepatic steatosis with loss of signal on the gradient echo opposed phase images. There are no morphologic changes of cirrhosis. The lesion of concern in segment V/VIII demonstrates mild T2 hyperintensity, restricted diffusion and intense arterial phase enhancement. It now measures 1.9 x 1.4 cm on image 32/19 (most recently 1.5 x 1.3 cm). This lesion does demonstrate non peripheral washout on the delayed phase images. An area of focal portal shunting inferolateral to this lesion is unchanged, accounting for a 1.7 x 0.8 cm enhancing lesion on image 36/20. Chronic lesion peripherally in the dome of the right hepatic lobe shows low T1 and low T2 signal, no enhancement and is stable from 2011, consistent with a benign finding. No evidence of gallstones, gallbladder wall thickening or biliary dilatation. Pancreas: Unremarkable. No pancreatic ductal dilatation or surrounding inflammatory changes. Spleen: Normal in  size without focal abnormality. Adrenals/Urinary Tract: Both adrenal glands appear normal. Both kidneys appear unremarkable, without hydronephrosis. Stomach/Bowel: The stomach appears unremarkable for its degree of distension. No evidence of bowel wall thickening, distention or surrounding inflammatory change. Vascular/Lymphatic: There are no enlarged abdominal lymph nodes. No other significant vascular findings aside from the portal shunting in the right hepatic lobe. No evidence of portal vein tumor. Other: No ascites. Musculoskeletal: No acute or significant osseous findings. IMPRESSION: 1. The lesion of concern centrally in the right hepatic lobe (at the border of segments V and VIII) appears slightly larger since the recent MRI of 10 weeks ago. This lesion demonstrates arterial phase enhancement, restricted diffusion and contrast washout, findings which are suspicious for hepatocellular carcinoma, especially given the patient's family history. If the patient has a history of cirrhosis, this could be categorized as a LI-RADS category 5 lesion. If personal risk factors for cirrhosis/liver disease are unclear, consider tissue sampling to confirm. 2. Inferior to the lesion, there is an area of portal shunting which appears unchanged. 3. Diffuse hepatic steatosis. Electronically Signed   By: Richardean Sale M.D.   On: 02/07/2021 17:05   US BIOPSY (LIVER)  Result Date: 02/27/2021 INDICATION: 71 year old male referred for biopsy of right liver mass EXAM: ULTRASOUND-GUIDED BIOPSY RIGHT LIVER MASS MEDICATIONS: None. ANESTHESIA/SEDATION: Moderate (conscious) sedation was employed during this procedure. A total of Versed 2.0 mg and Fentanyl 100 mcg was administered intravenously by the radiology nurse. Total intra-service moderate Sedation Time: 10 minutes. The patient's level of consciousness and vital signs were monitored continuously by radiology nursing throughout the procedure under my direct supervision.  FLUOROSCOPY TIME:  None COMPLICATIONS: None PROCEDURE: Informed written consent was obtained from the patient after a thorough discussion of the procedural risks, benefits and alternatives. All questions were addressed. Maximal Sterile Barrier Technique was utilized including caps, mask, sterile gowns, sterile gloves, sterile drape, hand hygiene and skin antiseptic. A timeout was performed prior to the initiation of the procedure. Ultrasound survey of the right liver lobe performed with images stored and  sent to PACs. The right lower thorax/right upper abdomen was prepped with chlorhexidine in a sterile fashion, and a sterile drape was applied covering the operative field. A sterile gown and sterile gloves were used for the procedure. Local anesthesia was provided with 1% Lidocaine. The patient was prepped and draped sterilely and the skin and subcutaneous tissues were generously infiltrated with 1% lidocaine. A 17 gauge introducer needle was then advanced under ultrasound guidance in an intercostal location into the right liver lobe, targeting hypoechoic mass of the right liver. The stylet was removed, and multiple separate 18 gauge core biopsy were retrieved. Samples were placed into formalin for transportation to the lab. Gel-Foam pledgets were then infused with a small amount of saline for assistance with hemostasis. The needle was removed, and a final ultrasound image was performed. The patient tolerated the procedure well and remained hemodynamically stable throughout. No complications were encountered and no significant blood loss was encounter. IMPRESSION: Status post ultrasound-guided biopsy of right liver mass. Signed, Dulcy Fanny. Dellia Nims, RPVI [facility] Electronically Signed   By: Corrie Mckusick D.O.   On: 02/27/2021 13:48     ASSESSMENT & PLAN:  A 71 y.o. male who I was asked to consult upon for what clinically appears to be stage IA (T1a N0  M0) hepatocellular carcinoma.  In clinic today, I went over his MRI images with him, for which he could see that 98-99% of his liver is uninvolved with disease.  Based upon having such a small, isolated lesion, this patient could either be served with localized ablation versus surgical resection.  I did speak to both interventional radiology and general surgery, who would like to have this patient's case presented at their weekly tumor conference.  It was also recommended that he be referred to a liver transplant clinic in [place].  Although the patient is not in need of a transplant at this time, his disease may progress over time to where such an option may need to be considered.  Ultimately, my hopes are that some form of local therapy can be used to treat his focal area of disease within the next month.  I will tentatively see this patient back in 3 months for repeat clinical assessment.  The patient understands all the plans discussed today and is in agreement with them.  I do appreciate [facility] for his new consult.   Nathali Vent Macarthur Critchley, MD

## 2021-03-06 ENCOUNTER — Other Ambulatory Visit: Payer: Self-pay

## 2021-03-06 ENCOUNTER — Other Ambulatory Visit: Payer: Self-pay | Admitting: Oncology

## 2021-03-06 ENCOUNTER — Inpatient Hospital Stay: Payer: Medicare Other | Attending: Oncology | Admitting: Oncology

## 2021-03-06 ENCOUNTER — Telehealth: Payer: Self-pay | Admitting: Oncology

## 2021-03-06 ENCOUNTER — Encounter: Payer: Self-pay | Admitting: Oncology

## 2021-03-06 ENCOUNTER — Inpatient Hospital Stay: Payer: Medicare Other

## 2021-03-06 DIAGNOSIS — Z803 Family history of malignant neoplasm of breast: Secondary | ICD-10-CM | POA: Diagnosis not present

## 2021-03-06 DIAGNOSIS — Z808 Family history of malignant neoplasm of other organs or systems: Secondary | ICD-10-CM | POA: Diagnosis not present

## 2021-03-06 DIAGNOSIS — C220 Liver cell carcinoma: Secondary | ICD-10-CM

## 2021-03-06 LAB — CBC AND DIFFERENTIAL
HCT: 45 (ref 41–53)
Hemoglobin: 15.2 (ref 13.5–17.5)
Neutrophils Absolute: 5.31
Platelets: 222 (ref 150–399)
WBC: 6.9

## 2021-03-06 LAB — BASIC METABOLIC PANEL
BUN: 22 — AB (ref 4–21)
CO2: 29 — AB (ref 13–22)
Chloride: 100 (ref 99–108)
Creatinine: 1 (ref 0.6–1.3)
Glucose: 361
Potassium: 4 (ref 3.4–5.3)
Sodium: 138 (ref 137–147)

## 2021-03-06 LAB — HEPATIC FUNCTION PANEL
ALT: 29 (ref 10–40)
AST: 25 (ref 14–40)
Alkaline Phosphatase: 95 (ref 25–125)
Bilirubin, Total: 0.9

## 2021-03-06 LAB — CBC: RBC: 5.07 (ref 3.87–5.11)

## 2021-03-06 LAB — COMPREHENSIVE METABOLIC PANEL
Albumin: 4.6 (ref 3.5–5.0)
Calcium: 9.6 (ref 8.7–10.7)

## 2021-03-06 NOTE — Telephone Encounter (Signed)
Per 03/06/21 los next appt scheduled and confirmed with patient °

## 2021-03-07 ENCOUNTER — Telehealth: Payer: Self-pay | Admitting: Gastroenterology

## 2021-03-07 NOTE — Telephone Encounter (Signed)
Inbound call from patient wife states the provider in [place] wanted to know why images at [facility]iology was order/. [phone]

## 2021-03-08 NOTE — Telephone Encounter (Signed)
Pt wife [name] wanted to know why pt had an appointment at [place]: Pt wife was notified that this only  a Consultation Visit with the Radiologist and that there are no Scans scheduled . [name] verbalized understanding with all questions answered :

## 2021-03-10 ENCOUNTER — Ambulatory Visit
Admission: RE | Admit: 2021-03-10 | Discharge: 2021-03-10 | Disposition: A | Discharge status: Home / Self Care | Payer: Medicare Other | Source: Ambulatory Visit | Attending: Gastroenterology | Admitting: Gastroenterology

## 2021-03-10 DIAGNOSIS — C220 Liver cell carcinoma: Secondary | ICD-10-CM

## 2021-03-10 NOTE — Consult Note (Signed)
Chief Complaint: Patient was seen in consultation today for hepatocellular carcinoma  at the request of Gupta,Rajesh  Referring Physician(s): Gupta,Rajesh; Lewis, Dequincy  History of Present Illness: Ronald Flores is a 71 y.o. male with an incidentally found right hepatic lesion on CT of the abdomen/pelvis for hematuria and stone work-up.  CT was performed on 11/23/2020.  Subsequently, the patient had MRI of the abdomen on 11/30/2020 that demonstrated an enhancing lesion between segments 5 and 8 and 53-month follow-up was recommended.  Follow-up MRI on 02/07/2021 raised concern for slight enlargement of the lesion and concern for an LI-RADS 5 lesion.  Patient underwent a ultrasound-guided biopsy of the lesion on 02/27/2001 and this demonstrated a well differentiated hepatocellular carcinoma.  Patient is essentially asymptomatic at this time.  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  He reports weight loss in the last year but he has had a emotionally difficult year with the recent loss of his brother to "liver" cancer.  Patient does not have the details of his brother's liver cancer diagnosis.  He denies chest pain, breathing problems, abdominal pain, bowel problems or urinary symptoms.  His most recent urinary stone was treated by urology.  Patient has seen Dr. Lavera Guise in oncology.  Dr. Bobby Rumpf discussed this patient with Interventional Radiology and Dr. Barry Dienes in general surgery.  Referral was also made to [facility] and the transplant service.  Patient was accompanied by his wife.  Past Medical History:  Diagnosis Date   Abdominal pain    DM (diabetes mellitus) ([place])    High blood pressure    Hypercholesteremia    Kidney stones    Loss of appetite    Nausea     Past Surgical History:  Procedure Laterality Date   CATARACT EXTRACTION  05/15/2016   COLONOSCOPY     Colonic polyp status post polypectomg. Mild sigmoid diverticulosis    Allergies: Patient has no  known allergies.  Medications: Prior to Admission medications   Medication Sig Start Date End Date Taking? Authorizing Provider  Exenatide ER 2 MG PEN Inject into the skin.    [provider]  hydrALAZINE (APRESOLINE) 25 MG tablet Take 25 mg by mouth 2 (two) times daily. 11/17/20   [provider]  lisinopril (ZESTRIL) 20 MG tablet Take by mouth.    [provider]  metFORMIN (GLUCOPHAGE) 1000 MG tablet Take by mouth.    [provider]  tamsulosin (FLOMAX) 0.4 MG CAPS capsule 1 capsule daily in the afternoon. 02/05/21   [provider]     Family History  Problem Relation Age of Onset   Breast cancer Mother    Liver cancer Brother    Colon cancer Neg Hx    Esophageal cancer Neg Hx     Social History   Socioeconomic History   Marital status: Unknown    Spouse name: Not on file   Number of children: Not on file   Years of education: Not on file   Highest education level: Not on file  Occupational History   Not on file  Tobacco Use   Smoking status: Never   Smokeless tobacco: Never  Vaping Use   Vaping Use: Never used  Substance and Sexual Activity   Alcohol use: Never   Drug use: Never   Sexual activity: Not on file  Other Topics Concern   Not on file  Social History Narrative   Not on file   Social Determinants of Health  Financial Resource Strain: Not on file  Food Insecurity: Not on file  Transportation Needs: Not on file  Physical Activity: Not on file  Stress: Not on file  Social Connections: Not on file    ECOG Status: 0 - Asymptomatic  Review of Systems: A 12 point ROS discussed and pertinent positives are indicated in the HPI above.  All other systems are negative.  Review of Systems  Constitutional:  Positive for unexpected weight change.  Respiratory: Negative.    Cardiovascular: Negative.   Gastrointestinal: Negative.   Genitourinary: Negative.    Vital Signs: There were no vitals taken for this  visit.  Physical Exam Constitutional:      Appearance: Normal appearance. He is not ill-appearing.  Cardiovascular:     Rate and Rhythm: Normal rate and regular rhythm.  Pulmonary:     Effort: Pulmonary effort is normal.     Breath sounds: Normal breath sounds.  Abdominal:     General: Abdomen is flat. There is no distension.     Palpations: Abdomen is soft.     Tenderness: There is no abdominal tenderness.  Neurological:     Mental Status: He is alert.       Imaging: US BIOPSY (LIVER)  Result Date: 02/27/2021 INDICATION: 71 year old male referred for biopsy of right liver mass EXAM: ULTRASOUND-GUIDED BIOPSY RIGHT LIVER MASS MEDICATIONS: None. ANESTHESIA/SEDATION: Moderate (conscious) sedation was employed during this procedure. A total of Versed 2.0 mg and Fentanyl 100 mcg was administered intravenously by the radiology nurse. Total intra-service moderate Sedation Time: 10 minutes. The patient's level of consciousness and vital signs were monitored continuously by radiology nursing throughout the procedure under my direct supervision. FLUOROSCOPY TIME:  None COMPLICATIONS: None PROCEDURE: Informed written consent was obtained from the patient after a thorough discussion of the procedural risks, benefits and alternatives. All questions were addressed. Maximal Sterile Barrier Technique was utilized including caps, mask, sterile gowns, sterile gloves, sterile drape, hand hygiene and skin antiseptic. A timeout was performed prior to the initiation of the procedure. Ultrasound survey of the right liver lobe performed with images stored and sent to PACs. The right lower thorax/right upper abdomen was prepped with chlorhexidine in a sterile fashion, and a sterile drape was applied covering the operative field. A sterile gown and sterile gloves were used for the procedure. Local anesthesia was provided with 1% Lidocaine. The patient was prepped and draped sterilely and the skin and subcutaneous  tissues were generously infiltrated with 1% lidocaine. A 17 gauge introducer needle was then advanced under ultrasound guidance in an intercostal location into the right liver lobe, targeting hypoechoic mass of the right liver. The stylet was removed, and multiple separate 18 gauge core biopsy were retrieved. Samples were placed into formalin for transportation to the lab. Gel-Foam pledgets were then infused with a small amount of saline for assistance with hemostasis. The needle was removed, and a final ultrasound image was performed. The patient tolerated the procedure well and remained hemodynamically stable throughout. No complications were encountered and no significant blood loss was encounter. IMPRESSION: Status post ultrasound-guided biopsy of right liver mass. Signed, Dulcy Fanny. Dellia Nims, RPVI [facility] Electronically Signed   By: Corrie Mckusick D.O.   On: 02/27/2021 13:48    Labs:  CBC: Recent Labs    01/09/21 1443 02/16/21 1459 02/27/21 1123 03/06/21 0000  WBC 5.5 5.3 5.4 6.9  HGB 13.9 13.6 14.7 15.2  HCT 41.8 41.0 43.0 45  PLT 216.0  218.0 212 222    COAGS: Recent Labs    01/09/21 1443 02/16/21 1459 02/27/21 1123  INR 0.9 0.9 0.9    BMP: Recent Labs    01/09/21 1443 02/16/21 1459 03/06/21 0000  NA 138 137 138  K 4.2 4.2 4.0  CL 103 101 100  CO2 29 29 29*  GLUCOSE 172* 194*  --   BUN 22 19 22*  CALCIUM 9.8 9.4 9.6  CREATININE 1.07 0.93 1.0    LIVER FUNCTION TESTS: Recent Labs    01/09/21 1443 02/16/21 1459 03/06/21 0000  BILITOT 0.5 0.6  --   AST 13 12 25   ALT 18 17 29   ALKPHOS 79 66 95  PROT 7.0 6.9  --   ALBUMIN 4.5 4.4 4.6    TUMOR MARKERS: Recent Labs    01/09/21 1443 02/16/21 1459  AFPTM 2.8 2.6  CEA  --  2.2    Assessment and Plan:  71 year old with an incidentally discovered hepatocellular carcinoma.  Imaging demonstrates hepatic steatosis but no definite cirrhosis.  1.9 cm  lesion in the right hepatic lobe bordering segments 5 and 8.  Patient has been referred to interventional radiology to discuss liver directed therapy.  Patient is also being referred to surgery and transplant.  With regards to liver directed therapy options, the patient is a candidate for percutaneous thermal ablation of this lesion.  I reviewed the patient's ultrasound-guided biopsy images and the lesion is very conspicuous with ultrasound and should be amendable for microwave ablation.  In addition, the lesion is less than 2 cm in maximum dimension and lower risk for incomplete treatment.  We discussed the procedure details which would include using general anesthesia and placing 1-2 microwave probes into the lesion.  Risks of the procedure include bleeding, infection, incomplete treatment and decreased liver function.  Procedure could be performed as an outpatient but told the patient that he would need to be prepared for overnight observation to control any post ablation symptoms.  Patient and his wife appear to have a very good understanding of the procedure.  We went over the imaging exams in detail.  At this point, the patient is a good candidate for percutaneous thermal ablation of the hepatocellular carcinoma.  He has an appointment to meet with liver transplant service and he will likely be meeting with Dr. Barry Dienes in surgery as well.  If the patient decides to proceed with the ablation, we will schedule him for a microwave ablation at [facility].  Thank you for this interesting consult.  I greatly enjoyed meeting Ronald Flores and look forward to participating in their care.  A copy of this report was sent to the requesting provider on this date.  Electronically Signed: Burman Riis 03/10/2021, 3:42 PM   I spent a total of  30 Minutes   in face to face in clinical consultation, greater than 50% of which was counseling/coordinating care for treatment of hepatocellular carcinoma.

## 2021-03-14 ENCOUNTER — Other Ambulatory Visit: Payer: Self-pay | Admitting: Nurse Practitioner

## 2021-03-14 DIAGNOSIS — C220 Liver cell carcinoma: Secondary | ICD-10-CM

## 2021-03-16 ENCOUNTER — Other Ambulatory Visit (HOSPITAL_COMMUNITY): Payer: Self-pay | Admitting: Diagnostic Radiology

## 2021-03-16 DIAGNOSIS — C220 Liver cell carcinoma: Secondary | ICD-10-CM

## 2021-03-22 ENCOUNTER — Other Ambulatory Visit: Payer: Self-pay

## 2021-03-22 NOTE — Progress Notes (Signed)
Sent message, via epic in basket, requesting orders in epic from surgeon.  

## 2021-03-22 NOTE — Progress Notes (Signed)
The proposed treatment discussed in conference is for discussion purpose only and is not a binding recommendation.  The patients have not been physically examined, or presented with their treatment options.  Therefore, final treatment plans cannot be decided.  

## 2021-03-23 ENCOUNTER — Other Ambulatory Visit: Payer: Self-pay | Admitting: Radiology

## 2021-03-24 ENCOUNTER — Other Ambulatory Visit: Payer: Self-pay | Admitting: Radiology

## 2021-03-30 NOTE — Progress Notes (Addendum)
COVID swab appointment: 04/10/21 @ 1200  COVID Vaccine Completed: yes x3 Date COVID Vaccine completed: 02/27/19, 03/25/19 Has received booster: 12/18/19 COVID vaccine manufacturer: Moderna    Date of COVID positive in last 90 days: no  PCP - [place] Cardiologist - n/a  Chest x-ray - n/a EKG - 03/31/21 Epic Stress Test - n/a ECHO - n/a Cardiac Cath - n/a Pacemaker/ICD device last checked: n/a Spinal Cord Stimulator: n/a  Bowel Prep - no  Sleep Study - n/a CPAP -   Fasting Blood Sugar - no checks at home Checks Blood Sugar __ times a day  Blood Thinner Instructions: n/a Aspirin Instructions: Last Dose:  Activity level: Can go up a flight of stairs and perform activities of daily living without stopping and without symptoms of chest pain or shortness of breath.     Anesthesia review: A1C 8.5, HTN, DM  Patient denies shortness of breath, fever, cough and chest pain at PAT appointment   Patient verbalized understanding of instructions that were given to them at the PAT appointment. Patient was also instructed that they will need to review over the PAT instructions again at home before surgery.

## 2021-03-30 NOTE — Patient Instructions (Addendum)
DUE TO COVID-19 ONLY ONE VISITOR IS ALLOWED TO COME WITH YOU AND STAY IN THE WAITING ROOM ONLY DURING PRE OP AND PROCEDURE.   **NO VISITORS ARE ALLOWED IN THE SHORT STAY AREA OR RECOVERY ROOM!!**  IF YOU WILL BE ADMITTED INTO THE HOSPITAL YOU ARE ALLOWED ONLY TWO SUPPORT PEOPLE DURING VISITATION HOURS ONLY (7 AM -8PM)   The support person(s) must pass our screening, gel in and out, and wear a mask at all times, including in the patients room. Patients must also wear a mask when staff or their support person are in the room. Visitors GUEST BADGE MUST BE WORN VISIBLY  One adult visitor may remain with you overnight and MUST be in the room by 8 P.M.  No visitors under the age of 13. Any visitor under the age of 37 must be accompanied by an adult.    COVID SWAB TESTING MUST BE COMPLETED ON:  04/10/21 @ 12:00 PM   Site: [facility] [address] [place] Enter: Main Entrance have a seat in the waiting area to the right of main entrance (DO NOT [place]!!!!!) Dial: [phone] to alert staff you have arrived  You are not required to quarantine, however you are required to wear a well-fitted mask when you are out and around people not in your household.  Hand Hygiene often Do NOT share personal items Notify your provider if you are in close contact with someone who has COVID or you develop fever 100.4 or greater, new onset of sneezing, cough, sore throat, shortness of breath or body aches.       Your procedure is scheduled on: 04/12/21   Report to [facility] Main Entrance    Report to admitting at 6:15 AM   Call this number if you have problems the morning of surgery [phone]   Do not eat food :After Midnight.   After Midnight you may have the following liquids until 5:30 AM DAY OF SURGERY  Water Black Coffee (sugar ok, NO MILK/CREAM OR CREAMERS)  Tea (sugar ok, NO MILK/CREAM OR CREAMERS) regular and decaf                             Plain  Jell-O (NO RED)                                           Fruit ices (not with fruit pulp, NO RED)                                     Popsicles (NO RED)                                                                  Juice: apple, WHITE grape, WHITE cranberry Sports drinks like Gatorade (NO RED) Clear broth(vegetable,chicken,beef)   FOLLOW BOWEL PREP AND ANY ADDITIONAL PRE OP INSTRUCTIONS YOU RECEIVED FROM YOUR SURGEON'S OFFICE!!!     Oral Hygiene is also important to reduce your risk of infection.  Remember - BRUSH YOUR TEETH THE MORNING OF SURGERY WITH YOUR REGULAR TOOTHPASTE   Stop all vitamins and supplements 7 days before surgery.   Take these medicines the morning of surgery with A SIP OF WATER: Hydralazine   DO NOT TAKE ANY ORAL DIABETIC MEDICATIONS DAY OF YOUR SURGERY  How to Manage Your Diabetes Before and After Surgery  Why is it important to control my blood sugar before and after surgery? Improving blood sugar levels before and after surgery helps healing and can limit problems. A way of improving blood sugar control is eating a healthy diet by:  Eating less sugar and carbohydrates  Increasing activity/exercise  Talking with your doctor about reaching your blood sugar goals High blood sugars (greater than 180 mg/dL) can raise your risk of infections and slow your recovery, so you will need to focus on controlling your diabetes during the weeks before surgery. Make sure that the doctor who takes care of your diabetes knows about your planned surgery including the date and location.  How do I manage my blood sugar before surgery? Check your blood sugar at least 4 times a day, starting 2 days before surgery, to make sure that the level is not too high or low. Check your blood sugar the morning of your surgery when you wake up and every 2 hours until you get to the Short Stay unit. If your blood sugar is less than 70 mg/dL, you will need  to treat for low blood sugar: Do not take insulin. Treat a low blood sugar (less than 70 mg/dL) with  cup of clear juice (cranberry or apple), 4 glucose tablets, OR glucose gel. Recheck blood sugar in 15 minutes after treatment (to make sure it is greater than 70 mg/dL). If your blood sugar is not greater than 70 mg/dL on recheck, call [phone] for further instructions. Report your blood sugar to the short stay nurse when you get to Short Stay.  If you are admitted to the hospital after surgery: Your blood sugar will be checked by the staff and you will probably be given insulin after surgery (instead of oral diabetes medicines) to make sure you have good blood sugar levels. The goal for blood sugar control after surgery is 80-180 mg/dL.   WHAT DO I DO ABOUT MY DIABETES MEDICATION?  Do not take oral diabetes medicines (pills) the morning of surgery.  THE DAY BEFORE SURGERY, take Metformin as prescribed        THE MORNING OF SURGERY, do not take Metformin.    Reviewed and Endorsed by [facility] Patient Education Committee, August 2015                               You may not have any metal on your body including jewelry, and body piercing             Do not wear lotions, powders, cologne, or deodorant              Men may shave face and neck.   Do not bring valuables to the hospital. [place].   Contacts, dentures or bridgework may not be worn into surgery.   Bring small overnight bag day of surgery.   Special Instructions: Bring a copy of your healthcare power of attorney and living will documents  the day of surgery if you haven't scanned them before.              Please read over the following fact sheets you were given: IF YOU HAVE QUESTIONS ABOUT YOUR PRE-OP INSTRUCTIONS PLEASE CALL [place] - Preparing for Surgery Before surgery, you can play an important role.  Because skin is not  sterile, your skin needs to be as free of germs as possible.  You can reduce the number of germs on your skin by washing with CHG (chlorahexidine gluconate) soap before surgery.  CHG is an antiseptic cleaner which kills germs and bonds with the skin to continue killing germs even after washing. Please DO NOT use if you have an allergy to CHG or antibacterial soaps.  If your skin becomes reddened/irritated stop using the CHG and inform your nurse when you arrive at Short Stay. Do not shave (including legs and underarms) for at least 48 hours prior to the first CHG shower.  You may shave your face/neck.  Please follow these instructions carefully:  1.  Shower with CHG Soap the night before surgery and the  morning of surgery.  2.  If you choose to wash your hair, wash your hair first as usual with your normal  shampoo.  3.  After you shampoo, rinse your hair and body thoroughly to remove the shampoo.                             4.  Use CHG as you would any other liquid soap.  You can apply chg directly to the skin and wash.  Gently with a scrungie or clean washcloth.  5.  Apply the CHG Soap to your body ONLY FROM THE NECK DOWN.   Do   not use on face/ open                           Wound or open sores. Avoid contact with eyes, ears mouth and   genitals (private parts).                       Wash face,  Genitals (private parts) with your normal soap.             6.  Wash thoroughly, paying special attention to the area where your    surgery  will be performed.  7.  Thoroughly rinse your body with warm water from the neck down.  8.  DO NOT shower/wash with your normal soap after using and rinsing off the CHG Soap.                9.  Pat yourself dry with a clean towel.            10.  Wear clean pajamas.            11.  Place clean sheets on your bed the night of your first shower and do not  sleep with pets. Day of Surgery : Do not apply any lotions/deodorants the morning of surgery.  Please wear  clean clothes to the hospital/surgery center.  FAILURE TO FOLLOW THESE INSTRUCTIONS MAY RESULT IN THE CANCELLATION OF YOUR SURGERY  PATIENT SIGNATURE_________________________________  NURSE SIGNATURE__________________________________  ________________________________________________________________________  WHAT IS A BLOOD TRANSFUSION? Blood Transfusion Information  A transfusion is the replacement of blood or some of its parts.  Blood is made up of multiple cells which provide different functions. Red blood cells carry oxygen and are used for blood loss replacement. White blood cells fight against infection. Platelets control bleeding. Plasma helps clot blood. Other blood products are available for specialized needs, such as hemophilia or other clotting disorders. BEFORE THE TRANSFUSION  Who gives blood for transfusions?  Healthy volunteers who are fully evaluated to make sure their blood is safe. This is blood bank blood. Transfusion therapy is the safest it has ever been in the practice of medicine. Before blood is taken from a donor, a complete history is taken to make sure that person has no history of diseases nor engages in risky social behavior (examples are intravenous drug use or sexual activity with multiple partners). The donor's travel history is screened to minimize risk of transmitting infections, such as malaria. The donated blood is tested for signs of infectious diseases, such as HIV and hepatitis. The blood is then tested to be sure it is compatible with you in order to minimize the chance of a transfusion reaction. If you or a relative donates blood, this is often done in anticipation of surgery and is not appropriate for emergency situations. It takes many days to process the donated blood. RISKS AND COMPLICATIONS Although transfusion therapy is very safe and saves many lives, the main dangers of transfusion include:  Getting an infectious disease. Developing a transfusion  reaction. This is an allergic reaction to something in the blood you were given. Every precaution is taken to prevent this. The decision to have a blood transfusion has been considered carefully by your caregiver before blood is given. Blood is not given unless the benefits outweigh the risks. AFTER THE TRANSFUSION Right after receiving a blood transfusion, you will usually feel much better and more energetic. This is especially true if your red blood cells have gotten low (anemic). The transfusion raises the level of the red blood cells which carry oxygen, and this usually causes an energy increase. The nurse administering the transfusion will monitor you carefully for complications. HOME CARE INSTRUCTIONS  No special instructions are needed after a transfusion. You may find your energy is better. Speak with your caregiver about any limitations on activity for underlying diseases you may have. SEEK MEDICAL CARE IF:  Your condition is not improving after your transfusion. You develop redness or irritation at the intravenous (IV) site. SEEK IMMEDIATE MEDICAL CARE IF:  Any of the following symptoms occur over the next 12 hours: Shaking chills. You have a temperature by mouth above 102 F (38.9 C), not controlled by medicine. Chest, back, or muscle pain. People around you feel you are not acting correctly or are confused. Shortness of breath or difficulty breathing. Dizziness and fainting. You get a rash or develop hives. You have a decrease in urine output. Your urine turns a dark color or changes to pink, red, or brown. Any of the following symptoms occur over the next 10 days: You have a temperature by mouth above 102 F (38.9 C), not controlled by medicine. Shortness of breath. Weakness after normal activity. The white part of the eye turns yellow (jaundice). You have a decrease in the amount of urine or are urinating less often. Your urine turns a dark color or changes to pink, red, or  brown. Document Released: 01/20/2000 Document Revised: 04/16/2011 Document Reviewed: 09/08/2007 [facility] Patient Information 2014 [place], [place].  _______________________________________________________________________

## 2021-03-31 ENCOUNTER — Other Ambulatory Visit: Payer: Self-pay

## 2021-03-31 ENCOUNTER — Encounter (HOSPITAL_COMMUNITY)
Admission: RE | Admit: 2021-03-31 | Discharge: 2021-03-31 | Disposition: A | Discharge status: Home / Self Care | Payer: Medicare Other | Source: Ambulatory Visit | Attending: Diagnostic Radiology | Admitting: Diagnostic Radiology

## 2021-03-31 ENCOUNTER — Encounter (HOSPITAL_COMMUNITY): Payer: Self-pay

## 2021-03-31 VITALS — BP 166/70 | HR 73 | Temp 97.7°F | Resp 18 | Ht 64.0 in | Wt 157.2 lb

## 2021-03-31 DIAGNOSIS — Z01818 Encounter for other preprocedural examination: Secondary | ICD-10-CM | POA: Insufficient documentation

## 2021-03-31 DIAGNOSIS — E119 Type 2 diabetes mellitus without complications: Secondary | ICD-10-CM | POA: Diagnosis not present

## 2021-03-31 HISTORY — DX: Cardiac murmur, unspecified: R01.1

## 2021-03-31 HISTORY — DX: Personal history of urinary calculi: Z87.442

## 2021-03-31 LAB — COMPREHENSIVE METABOLIC PANEL
ALT: 21 U/L (ref 0–44)
AST: 14 U/L — ABNORMAL LOW (ref 15–41)
Albumin: 4.1 g/dL (ref 3.5–5.0)
Alkaline Phosphatase: 71 U/L (ref 38–126)
Anion gap: 8 (ref 5–15)
BUN: 18 mg/dL (ref 8–23)
CO2: 26 mmol/L (ref 22–32)
Calcium: 9.1 mg/dL (ref 8.9–10.3)
Chloride: 99 mmol/L (ref 98–111)
Creatinine, Ser: 0.89 mg/dL (ref 0.61–1.24)
GFR, Estimated: >60 mL/min (ref >60)
Glucose, Bld: 285 mg/dL — ABNORMAL HIGH (ref 70–99)
Potassium: 4.1 mmol/L (ref 3.5–5.1)
Sodium: 133 mmol/L — ABNORMAL LOW (ref 135–145)
Total Bilirubin: 0.4 mg/dL (ref 0.3–1.2)
Total Protein: 7 g/dL (ref 6.5–8.1)

## 2021-03-31 LAB — CBC WITH DIFFERENTIAL/PLATELET
Abs Immature Granulocytes: 0.01 10*3/uL (ref 0.00–0.07)
Basophils Absolute: 0 10*3/uL (ref 0.0–0.1)
Basophils Relative: 0 %
Eosinophils Absolute: 0.1 10*3/uL (ref 0.0–0.5)
Eosinophils Relative: 1 %
HCT: 41.7 % (ref 39.0–52.0)
Hemoglobin: 14.1 g/dL (ref 13.0–17.0)
Immature Granulocytes: 0 %
Lymphocytes Relative: 13 %
Lymphs Abs: 0.9 10*3/uL (ref 0.7–4.0)
MCH: 29.8 pg (ref 26.0–34.0)
MCHC: 33.8 g/dL (ref 30.0–36.0)
MCV: 88.2 fL (ref 80.0–100.0)
Monocytes Absolute: 0.6 10*3/uL (ref 0.1–1.0)
Monocytes Relative: 8 %
Neutro Abs: 5.4 10*3/uL (ref 1.7–7.7)
Neutrophils Relative %: 78 %
Platelets: 202 10*3/uL (ref 150–400)
RBC: 4.73 MIL/uL (ref 4.22–5.81)
RDW: 12.9 % (ref 11.5–15.5)
WBC: 7 10*3/uL (ref 4.0–10.5)
nRBC: 0 % (ref 0.0–0.2)

## 2021-03-31 LAB — PROTIME-INR
INR: 0.9 (ref 0.8–1.2)
Prothrombin Time: 12.3 seconds (ref 11.4–15.2)

## 2021-03-31 LAB — GLUCOSE, CAPILLARY: Glucose-Capillary: 284 mg/dL — ABNORMAL HIGH (ref 70–99)

## 2021-03-31 LAB — HEMOGLOBIN A1C
Hgb A1c MFr Bld: 8.5 % — ABNORMAL HIGH (ref 4.8–5.6)
Mean Plasma Glucose: 197.25 mg/dL

## 2021-03-31 LAB — TYPE AND SCREEN
ABO/RH(D): O POS
Antibody Screen: NEGATIVE

## 2021-04-03 ENCOUNTER — Encounter: Payer: Self-pay | Admitting: Certified Registered Nurse Anesthetist

## 2021-04-04 ENCOUNTER — Encounter: Payer: Medicare Other | Admitting: Gastroenterology

## 2021-04-04 ENCOUNTER — Telehealth: Payer: Self-pay | Admitting: Gastroenterology

## 2021-04-10 ENCOUNTER — Ambulatory Visit
Admission: RE | Admit: 2021-04-10 | Discharge: 2021-04-10 | Disposition: A | Discharge status: Home / Self Care | Payer: Medicare Other | Source: Ambulatory Visit | Attending: Nurse Practitioner | Admitting: Nurse Practitioner

## 2021-04-10 ENCOUNTER — Other Ambulatory Visit: Payer: Self-pay

## 2021-04-10 ENCOUNTER — Encounter (HOSPITAL_COMMUNITY)
Admission: RE | Admit: 2021-04-10 | Discharge: 2021-04-10 | Disposition: A | Discharge status: Home / Self Care | Payer: Medicare Other | Source: Ambulatory Visit | Attending: Diagnostic Radiology | Admitting: Diagnostic Radiology

## 2021-04-10 DIAGNOSIS — Z20822 Contact with and (suspected) exposure to covid-19: Secondary | ICD-10-CM | POA: Insufficient documentation

## 2021-04-10 DIAGNOSIS — Z01812 Encounter for preprocedural laboratory examination: Secondary | ICD-10-CM | POA: Insufficient documentation

## 2021-04-10 DIAGNOSIS — C220 Liver cell carcinoma: Secondary | ICD-10-CM

## 2021-04-10 LAB — SARS CORONAVIRUS 2 (TAT 6-24 HRS): SARS Coronavirus 2: NEGATIVE

## 2021-04-10 IMAGING — CT CT CHEST W/ CM
2 of 5 series · 14 of 36 positions shown, 17 images · IV contrast (agent unspecified)
Comparison: 10/28/2009 chest CT.  Abdominal MRI of 02/07/2021.

CLINICAL DATA: Hepatocellular carcinoma diagnosed earlier this
month. Staging. Preop. * onc *

EXAM:
CT CHEST WITH CONTRAST
TECHNIQUE: Multidetector CT imaging of the chest was performed during
intravenous contrast administration.

[Series 4: chest 2.00 br40 s3 · coronal · 0.65mm/px · 3 of 148 slices shown]
[im 30/148  lung]
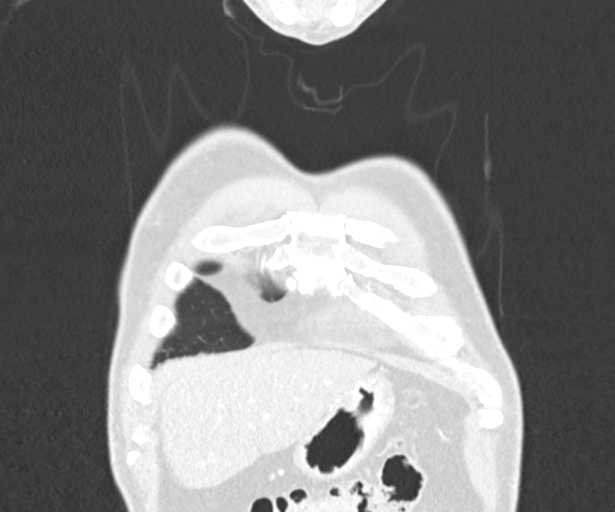
[im 59/148  lung]
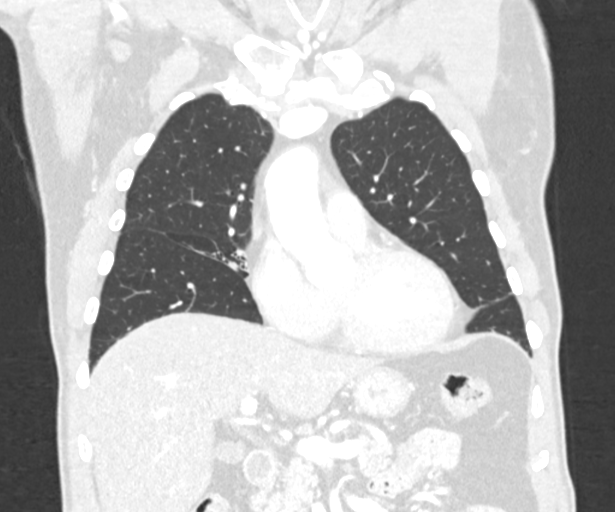
[im 89/148  lung]
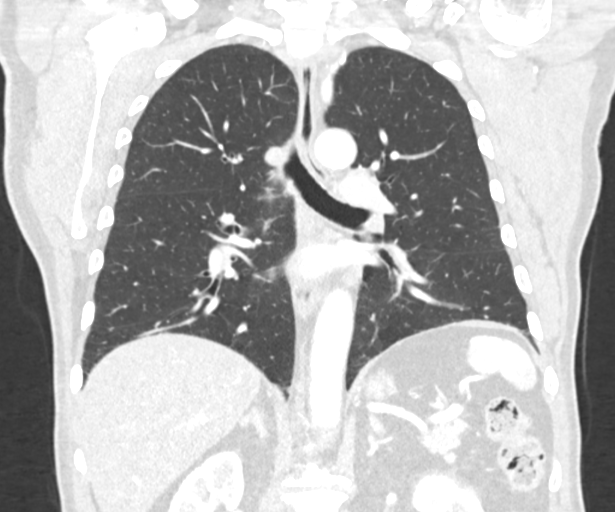

[Series 10: chest 1.00 br40 s3 · axial · 0.70mm/px · z∈[+1472,+1757]mm · 11 of 412 slices shown, 14 images]
[im 28/412  mediastinal]
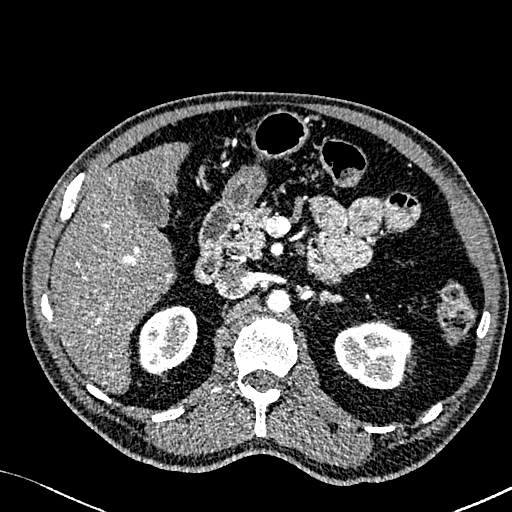
[im 28/412  lung]
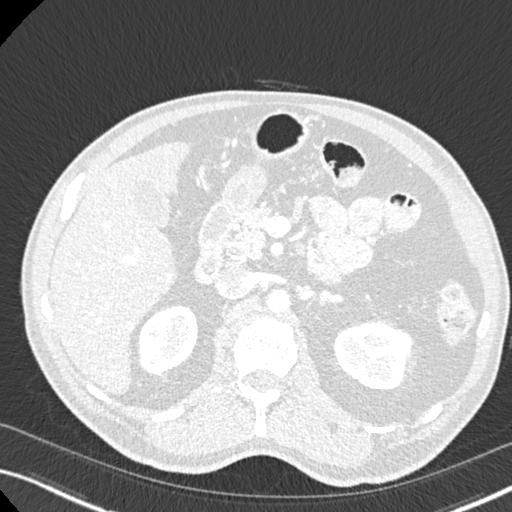
[im 55/412  lung]
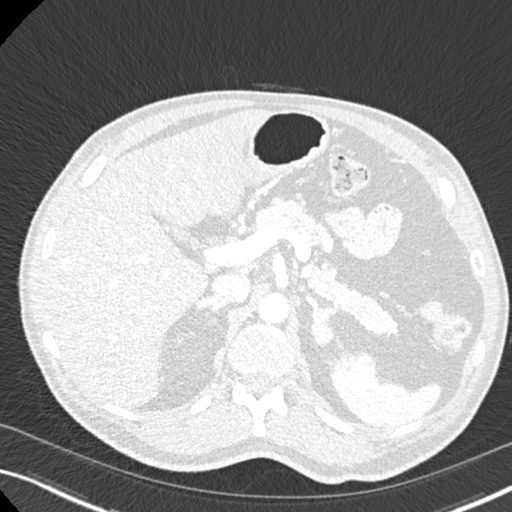
[im 110/412  lung]
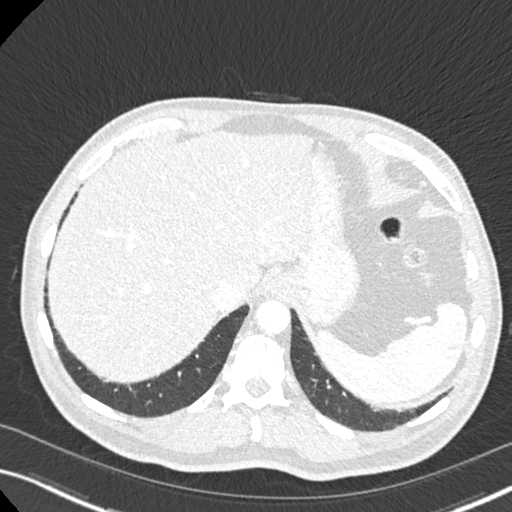
[im 138/412  lung]
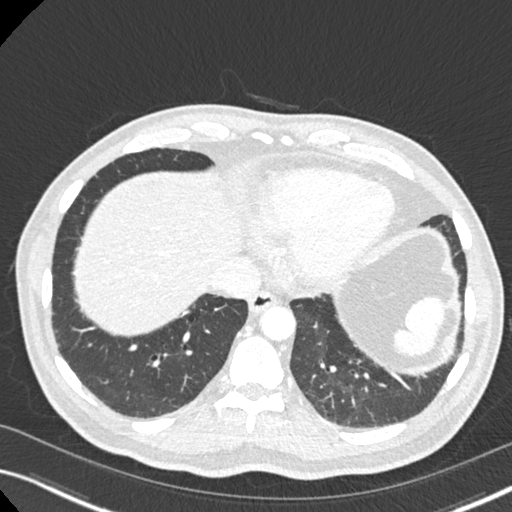
[im 165/412  mediastinal]
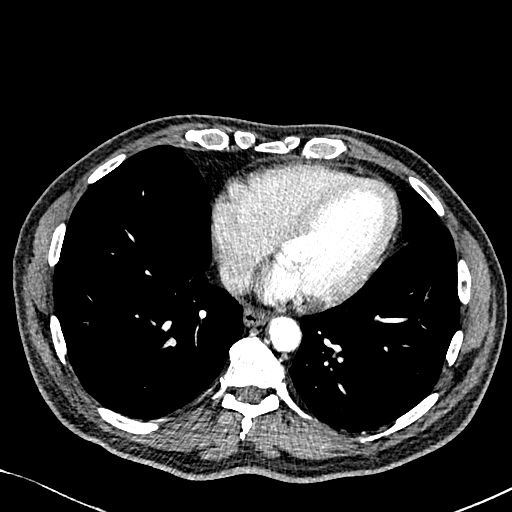
[im 165/412  lung]
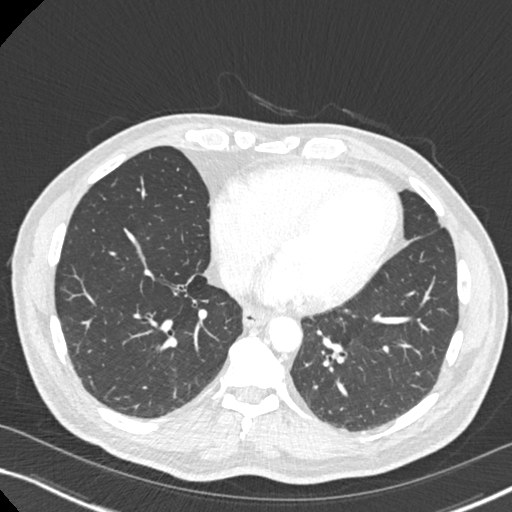
[im 220/412  lung]
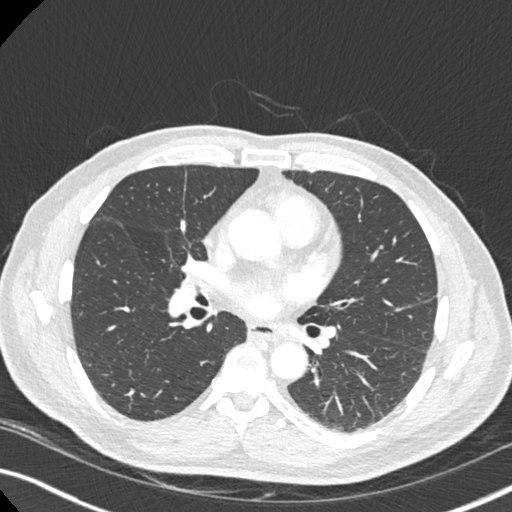
[im 247/412  lung]
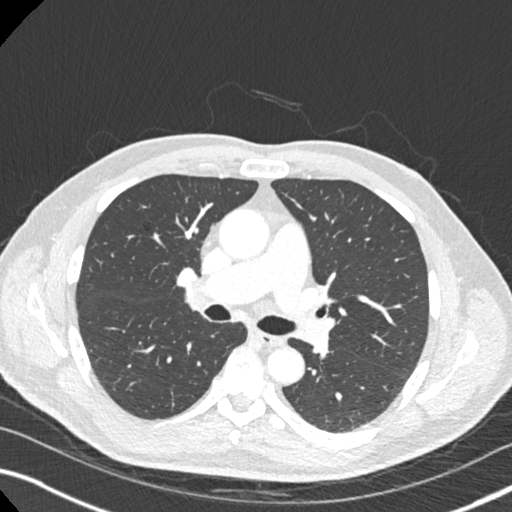
[im 275/412  lung]
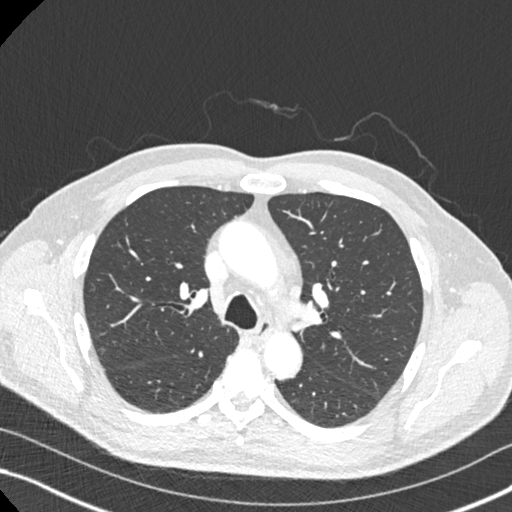
[im 302/412  mediastinal]
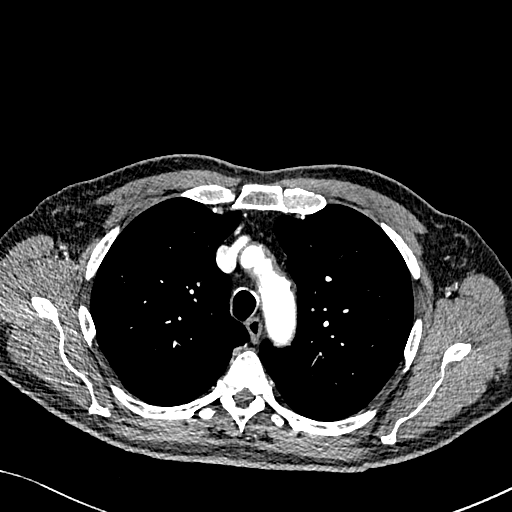
[im 302/412  lung]
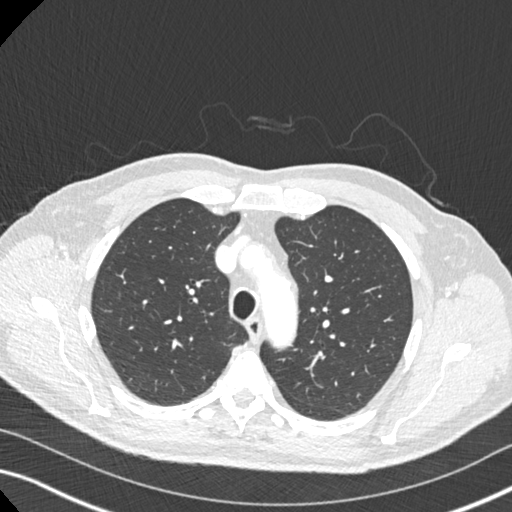
[im 357/412  lung]
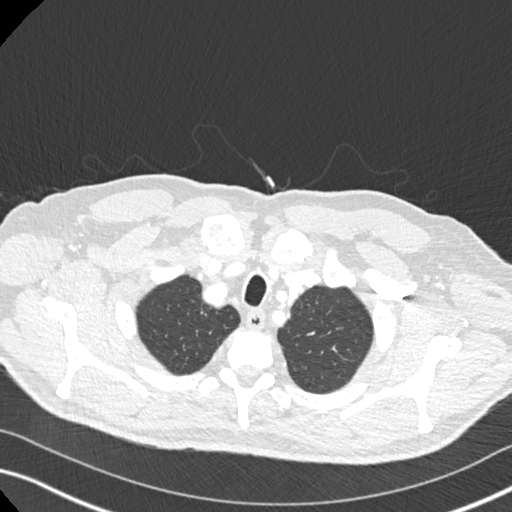
[im 384/412  lung]
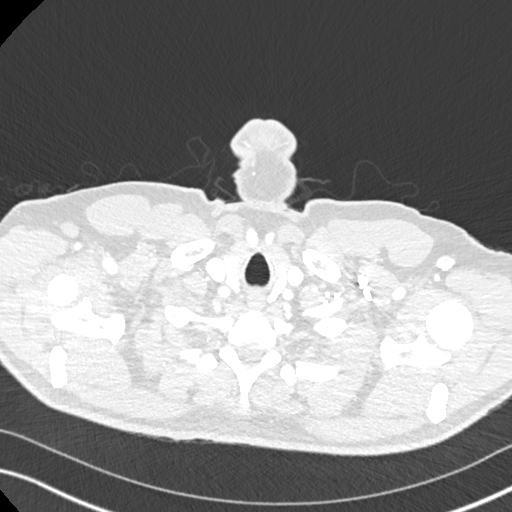

[14 of 36 positions shown; findings below may reference images not displayed]

RADIATION DOSE REDUCTION: This exam was performed according to the
departmental dose-optimization program which includes automated
exposure control, adjustment of the mA and/or kV according to
patient size and/or use of iterative reconstruction technique.

CONTRAST:  75mL ZX54XL-1MM IOPAMIDOL (ZX54XL-1MM) INJECTION 61%
FINDINGS: Cardiovascular: Aortic atherosclerosis. Tortuous thoracic aorta.
Normal heart size, without pericardial effusion. No central
pulmonary embolism, on this non-dedicated study.

Mediastinum/Nodes: No supraclavicular adenopathy. No mediastinal or
hilar adenopathy.

Subtle esophageal fluid level including on 78/2.

Lungs/Pleura: No pleural fluid. Chronic posterior right middle lobe
volume loss and cylindrical bronchiectasis, likely secondary to
remote infection or inflammation. Apparent nondependent
endobronchial nodule of 6 mm within the bronchus intermedius on 74/8
and coronal image 85. New since 2699.

Posterior right upper lobe calcified granuloma.

2 mm left upper lobe pulmonary nodule on 73/8, not apparent on
prior.

Minimal volume loss in the posterior lingula.

3 mm nodule along the left major fissure on 79/8 is likely a
subpleural lymph node.

Posterior right upper lobe 3 mm nodule on 33/8 is favored to be
present on the prior.

Upper Abdomen: Hepatic steatosis. Right hepatic lobe lesion has been
previously characterized and is grossly similar at 1.8 cm on 137/2.
Adjacent perfusion anomaly. Normal imaged portions of the spleen,
stomach, pancreas, gallbladder, adrenal glands, kidneys.

Musculoskeletal: No acute osseous abnormality.
IMPRESSION: 1. No typical findings of metastatic disease within the chest.
2. Scattered tiny bilateral pulmonary nodules, most likely benign.
Consider chest CT follow-up at 6 months.
3. Chronic posterior right middle lobe volume loss and
bronchiectasis. Bronchus intermedius 6 mm soft tissue density,
suspicious for polyp. Unlikely to represent metastatic disease.
Potential clinical strategies include chest CT follow-up at 6-12
weeks (to confirm persistence and exclude less likely retained
secretions) versus more aggressive evaluation with bronchoscopy or
PET.
4. Grossly similar size of the hypervascular liver lesion compared
to 02/07/2021 dedicated MRI.
5.  Aortic Atherosclerosis ([id number]).
6. Esophageal air fluid level suggests dysmotility or
gastroesophageal reflux.

## 2021-04-10 MED ORDER — IOPAMIDOL (ISOVUE-300) INJECTION 61%
75.0000 mL | Freq: Once | INTRAVENOUS | Status: AC | PRN
  Administered 2021-04-10: 75 mL via INTRAVENOUS

## 2021-04-11 ENCOUNTER — Other Ambulatory Visit: Payer: Self-pay | Admitting: Internal Medicine

## 2021-04-11 DIAGNOSIS — I10 Essential (primary) hypertension: Secondary | ICD-10-CM | POA: Diagnosis not present

## 2021-04-11 DIAGNOSIS — K760 Fatty (change of) liver, not elsewhere classified: Secondary | ICD-10-CM | POA: Diagnosis not present

## 2021-04-11 DIAGNOSIS — E7800 Pure hypercholesterolemia, unspecified: Secondary | ICD-10-CM | POA: Diagnosis not present

## 2021-04-11 DIAGNOSIS — Z7984 Long term (current) use of oral hypoglycemic drugs: Secondary | ICD-10-CM | POA: Diagnosis not present

## 2021-04-11 DIAGNOSIS — E119 Type 2 diabetes mellitus without complications: Secondary | ICD-10-CM | POA: Diagnosis not present

## 2021-04-11 DIAGNOSIS — C220 Liver cell carcinoma: Secondary | ICD-10-CM | POA: Diagnosis present

## 2021-04-11 DIAGNOSIS — I700 Atherosclerosis of aorta: Secondary | ICD-10-CM | POA: Diagnosis not present

## 2021-04-11 DIAGNOSIS — Z79899 Other long term (current) drug therapy: Secondary | ICD-10-CM | POA: Diagnosis not present

## 2021-04-11 NOTE — H&P (Deleted)
  The note originally documented on this encounter has been moved the the encounter in which it belongs.  

## 2021-04-11 NOTE — H&P (Signed)
Chief Complaint: Hepatocellular carcinoma  Referring Physician(s): Jackquline Denmark  Supervising Physician: Markus Daft  Patient Status: [facility] - Out-pt  History of Present Illness: Ronald Flores is a 71 y.o. male with an incidentally found right hepatic lesion on CT of the abdomen/pelvis for hematuria and stone work-up which was done on 11/23/2020.    MRI of the abdomen on 11/30/2020 showed an enhancing lesion between segments 5 and 8 and 52-monthfollow-up was recommended.    Follow-up MRI on 02/07/2021 raised concern for slight enlargement of the lesion and concern for an LI-RADS 5 lesion.    He underwent a ultrasound-guided biopsy on 02/27/2001.  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   He was seen by Dr. HAnselm Pancoastfor minimally invasive treatment options and he felt he was a good candidate for thermal ablation.  He is here today for the procedure. He is NPO. No nausea/vomiting. No Fever/chills. ROS negative.   Past Medical History:  Diagnosis Date   Abdominal pain    [place]    Heart murmur    as a child   High blood pressure    History of kidney stones    Hypercholesteremia    Kidney stones    Loss of appetite    Nausea     Past Surgical History:  Procedure Laterality Date   CATARACT EXTRACTION  05/15/2016   COLONOSCOPY     Colonic polyp status post polypectomg. Mild sigmoid diverticulosis   CYSTOSCOPY W/ URETEROSCOPY W/ LITHOTRIPSY     WISDOM TOOTH EXTRACTION      Allergies: Patient has no known allergies.  Medications: Prior to Admission medications   Medication Sig Start Date End Date Taking? Authorizing Provider  Exenatide ER 2 MG PEN Inject 2 mg into the skin every 7 (seven) days.    [provider]  hydrALAZINE (APRESOLINE) 25 MG tablet Take 25 mg by mouth 2 (two) times daily. 11/17/20   [provider]  lisinopril (ZESTRIL) 20 MG tablet Take 40 mg by mouth in the morning.     [provider]  metFORMIN (GLUCOPHAGE) 1000 MG tablet Take 1,000 mg by mouth 2 (two) times daily with a meal.    [provider]  tamsulosin (FLOMAX) 0.4 MG CAPS capsule 0.4 mg every evening. 02/05/21   [provider]     Family History  Problem Relation Age of Onset   Breast cancer Mother    Liver cancer Brother    Colon cancer Neg Hx    Esophageal cancer Neg Hx     Social History   Socioeconomic History   Marital status: Unknown    Spouse name: Not on file   Number of children: Not on file   Years of education: Not on file   Highest education level: Not on file  Occupational History   Not on file  Tobacco Use   Smoking status: Never   Smokeless tobacco: Never  Vaping Use   Vaping Use: Never used  Substance and Sexual Activity   Alcohol use: Never   Drug use: Never   Sexual activity: Not on file  Other Topics Concern   Not on file  Social History Narrative   Not on file   Social Determinants of Health   Financial Resource Strain: Not on file  Food Insecurity: Not on file  Transportation Needs: Not on file  Physical Activity: Not on file  Stress: Not on file  Social Connections: Not on file  Review of Systems: A 12 point ROS discussed and pertinent positives are indicated in the HPI above.  All other systems are negative.  Review of Systems  Vital Signs: Ht '5\' 4"'$  (1.626 m)    Wt 157 lb 3 oz (71.3 kg)    BMI 26.98 kg/m   Physical Exam Constitutional:      Appearance: Normal appearance.  HENT:     Head: Normocephalic and atraumatic.  Cardiovascular:     Rate and Rhythm: Normal rate and regular rhythm.  Pulmonary:     Effort: Pulmonary effort is normal. No respiratory distress.     Breath sounds: Normal breath sounds.  Abdominal:     Palpations: Abdomen is soft.     Tenderness: There is no abdominal tenderness.  Skin:    General: Skin is warm and dry.  Neurological:     General: No focal deficit present.     Mental  Status: He is alert and oriented to person, place, and time.  Psychiatric:        Mood and Affect: Mood normal.        Behavior: Behavior normal.        Thought Content: Thought content normal.        Judgment: Judgment normal.    Imaging: DG Chest 1 View  Result Date: 04/12/2021 CLINICAL DATA:  Preop for ablation of hepatocellular carcinoma. No chest complaints. EXAM: CHEST  1 VIEW COMPARISON:  Chest CT of 2 days ago FINDINGS: Midline trachea. Normal heart size and mediastinal contours. No pleural effusion or pneumothorax. The right middle lobe volume loss is only subtly apparent. Left lung is clear. IMPRESSION: No acute cardiopulmonary disease. Right middle lobe volume loss detailed on CT is only subtly apparent. The bronchus intermedius probable polyp is not plain film evident. Electronically Signed   By: Abigail Miyamoto M.D.   On: 04/12/2021 07:43   CT CHEST W CONTRAST  Result Date: 04/10/2021 CLINICAL DATA:  Hepatocellular carcinoma diagnosed earlier this month. Staging. Preop. * onc * EXAM: CT CHEST WITH CONTRAST TECHNIQUE: Multidetector CT imaging of the chest was performed during intravenous contrast administration. RADIATION DOSE REDUCTION: This exam was performed according to the departmental dose-optimization program which includes automated exposure control, adjustment of the mA and/or kV according to patient size and/or use of iterative reconstruction technique. CONTRAST:  41m ISOVUE-300 IOPAMIDOL (ISOVUE-300) INJECTION 61% COMPARISON:  10/28/2009 chest CT.  Abdominal MRI of 02/07/2021. FINDINGS: Cardiovascular: Aortic atherosclerosis. Tortuous thoracic aorta. Normal heart size, without pericardial effusion. No central pulmonary embolism, on this non-dedicated study. Mediastinum/Nodes: No supraclavicular adenopathy. No mediastinal or hilar adenopathy. Subtle esophageal fluid level including on 78/2. Lungs/Pleura: No pleural fluid. Chronic posterior right middle lobe volume loss and  cylindrical bronchiectasis, likely secondary to remote infection or inflammation. Apparent nondependent endobronchial nodule of 6 mm within the bronchus intermedius on 74/8 and coronal image 85. New since 2011. Posterior right upper lobe calcified granuloma. 2 mm left upper lobe pulmonary nodule on 73/8, not apparent on prior. Minimal volume loss in the posterior lingula. 3 mm nodule along the left major fissure on 79/8 is likely a subpleural lymph node. Posterior right upper lobe 3 mm nodule on 33/8 is favored to be present on the prior. Upper Abdomen: Hepatic steatosis. Right hepatic lobe lesion has been previously characterized and is grossly similar at 1.8 cm on 137/2. Adjacent perfusion anomaly. Normal imaged portions of the spleen, stomach, pancreas, gallbladder, adrenal glands, kidneys. Musculoskeletal: No acute osseous abnormality. IMPRESSION: 1. No typical findings  of metastatic disease within the chest. 2. Scattered tiny bilateral pulmonary nodules, most likely benign. Consider chest CT follow-up at 6 months. 3. Chronic posterior right middle lobe volume loss and bronchiectasis. Bronchus intermedius 6 mm soft tissue density, suspicious for polyp. Unlikely to represent metastatic disease. Potential clinical strategies include chest CT follow-up at 6-12 weeks (to confirm persistence and exclude less likely retained secretions) versus more aggressive evaluation with bronchoscopy or PET. 4. Grossly similar size of the hypervascular liver lesion compared to 02/07/2021 dedicated MRI. 5.  Aortic Atherosclerosis (ICD10-I70.0). 6. Esophageal air fluid level suggests dysmotility or gastroesophageal reflux. Electronically Signed   By: Abigail Miyamoto M.D.   On: 04/10/2021 16:52    Labs:  CBC: Recent Labs    02/16/21 1459 02/27/21 1123 03/06/21 0000 03/31/21 1258  WBC 5.3 5.4 6.9 7.0  HGB 13.6 14.7 15.2 14.1  HCT 41.0 43.0 45 41.7  PLT 218.0 212 222 202    COAGS: Recent Labs    01/09/21 1443  02/16/21 1459 02/27/21 1123 03/31/21 1258  INR 0.9 0.9 0.9 0.9    BMP: Recent Labs    01/09/21 1443 02/16/21 1459 03/06/21 0000 03/31/21 1258  NA 138 137 138 133*  K 4.2 4.2 4.0 4.1  CL 103 101 100 99  CO2 29 29 29* 26  GLUCOSE 172* 194*  --  285*  BUN 22 19 22* 18  CALCIUM 9.8 9.4 9.6 9.1  CREATININE 1.07 0.93 1.0 0.89  GFRNONAA  --   --   --  >60    LIVER FUNCTION TESTS: Recent Labs    01/09/21 1443 02/16/21 1459 03/06/21 0000 03/31/21 1258  BILITOT 0.5 0.6  --  0.4  AST '13 12 25 '$ 14*  ALT '18 17 29 21  '$ ALKPHOS 79 66 95 71  PROT 7.0 6.9  --  7.0  ALBUMIN 4.5 4.4 4.6 4.1    TUMOR MARKERS: Recent Labs    01/09/21 1443 02/16/21 1459  AFPTM 2.8 2.6  CEA  --  2.2    Assessment and Plan:  Hepatocellular carcinoma  Will proceed with image guided thermal ablation today by Dr. Anselm Pancoast.  Risks and benefits discussed with the patient including, but not limited to bleeding, infection, liver failure, bile duct injury, pneumothorax or damage to adjacent structures.  All of the patient's questions were answered, patient is agreeable to proceed. Consent signed and in chart.   Thank you for allowing our service to participate in Ronald Flores 's care.  Electronically Signed: Murrell Redden, PA-C   04/12/2021, 8:15 AM      I spent a total of    25 Minutes in face to face in clinical consultation, greater than 50% of which was counseling/coordinating care for microwave ablation liver lesion.

## 2021-04-11 NOTE — Anesthesia Preprocedure Evaluation (Addendum)
Anesthesia Evaluation  ?Patient identified by MRN, date of birth, ID band ?Patient awake ? ? ? ?Reviewed: ?Allergy & Precautions, NPO status , Patient's Chart, lab work & pertinent test results ? ?Airway ?Mallampati: II ? ?TM Distance: >3 FB ?Neck ROM: Full ? ? ? Dental ? ?(+) Partial Upper ?  ?Pulmonary ?neg pulmonary ROS,  ?  ?Pulmonary exam normal ? ? ? ? ? ? ? Cardiovascular ?hypertension, Pt. on medications ? ?Rhythm:Regular Rate:Normal ? ? ?  ?Neuro/Psych ?negative neurological ROS ? negative psych ROS  ? GI/Hepatic ?negative GI ROS, Neg liver ROS, HCC ?  ?Endo/Other  ?diabetes, Type 2, Oral Hypoglycemic Agents ? Renal/GU ?negative Renal ROS  ?negative genitourinary ?  ?Musculoskeletal ?negative musculoskeletal ROS ?(+)  ? Abdominal ?Normal abdominal exam  (+)   ?Peds ?negative pediatric ROS ?(+)  Hematology ?negative hematology ROS ?(+)   ?Anesthesia Other Findings ? ? Reproductive/Obstetrics ? ?  ? ? ? ? ? ? ? ? ? ? ? ? ? ?  ?  ? ? ? ? ? ? ? ?Anesthesia Physical ?Anesthesia Plan ? ?ASA: 3 ? ?Anesthesia Plan: General  ? ?Post-op Pain Management:   ? ?Induction: Intravenous ? ?PONV Risk Score and Plan: 2 and Ondansetron, Dexamethasone and Treatment may vary due to age or medical condition ? ?Airway Management Planned: Oral ETT and Mask ? ?Additional Equipment: None ? ?Intra-op Plan:  ? ?Post-operative Plan: Extubation in OR ? ?Informed Consent: I have reviewed the patients History and Physical, chart, labs and discussed the procedure including the risks, benefits and alternatives for the proposed anesthesia with the patient or authorized representative who has indicated his/her understanding and acceptance.  ? ? ? ?Dental advisory given ? ?Plan Discussed with: CRNA ? ?Anesthesia Plan Comments: (Lab Results ?     Component                Value               Date                 ?     WBC                      7.0                 03/31/2021           ?     HGB                       14.1                03/31/2021           ?     HCT                      41.7                03/31/2021           ?     MCV                      88.2                03/31/2021           ?     PLT  202                 03/31/2021           ?Lab Results ?     Component                Value               Date                 ?     NA                       133 (L)             03/31/2021           ?     K                        4.1                 03/31/2021           ?     CO2                      26                  03/31/2021           ?     GLUCOSE                  285 (H)             03/31/2021           ?     BUN                      18                  03/31/2021           ?     CREATININE               0.89                03/31/2021           ?     CALCIUM                  9.1                 03/31/2021           ?     GFRNONAA                 >60                 03/31/2021           ?Lab Results ?     Component                Value               Date                 ?     ALT                      21  03/31/2021           ?     AST                      14 (L)              03/31/2021           ?     ALKPHOS                  71                  03/31/2021           ?     BILITOT                  0.4                 03/31/2021          )  ? ? ? ? ? ?Anesthesia Quick Evaluation ? ?

## 2021-04-12 ENCOUNTER — Other Ambulatory Visit: Payer: Self-pay | Admitting: Physician Assistant

## 2021-04-12 ENCOUNTER — Encounter (HOSPITAL_COMMUNITY): Payer: Self-pay | Admitting: Diagnostic Radiology

## 2021-04-12 ENCOUNTER — Ambulatory Visit (HOSPITAL_COMMUNITY)
Admission: RE | Admit: 2021-04-12 | Discharge: 2021-04-12 | Disposition: A | Discharge status: Home / Self Care | Payer: Medicare Other | Attending: Diagnostic Radiology | Admitting: Diagnostic Radiology

## 2021-04-12 ENCOUNTER — Encounter (HOSPITAL_COMMUNITY)
Admission: RE | Disposition: A | Discharge status: Home / Self Care | Payer: Self-pay | Source: Home / Self Care | Attending: Diagnostic Radiology

## 2021-04-12 ENCOUNTER — Ambulatory Visit (HOSPITAL_BASED_OUTPATIENT_CLINIC_OR_DEPARTMENT_OTHER): Payer: Medicare Other | Admitting: Registered Nurse

## 2021-04-12 ENCOUNTER — Ambulatory Visit (HOSPITAL_COMMUNITY): Payer: Medicare Other

## 2021-04-12 ENCOUNTER — Ambulatory Visit (HOSPITAL_COMMUNITY): Payer: Medicare Other | Admitting: Physician Assistant

## 2021-04-12 ENCOUNTER — Ambulatory Visit (HOSPITAL_COMMUNITY)
Admission: RE | Admit: 2021-04-12 | Discharge: 2021-04-12 | Disposition: A | Discharge status: Home / Self Care | Payer: Medicare Other | Source: Ambulatory Visit | Attending: Diagnostic Radiology | Admitting: Diagnostic Radiology

## 2021-04-12 DIAGNOSIS — C220 Liver cell carcinoma: Secondary | ICD-10-CM

## 2021-04-12 DIAGNOSIS — Z7984 Long term (current) use of oral hypoglycemic drugs: Secondary | ICD-10-CM

## 2021-04-12 DIAGNOSIS — Z01818 Encounter for other preprocedural examination: Secondary | ICD-10-CM

## 2021-04-12 DIAGNOSIS — E119 Type 2 diabetes mellitus without complications: Secondary | ICD-10-CM | POA: Insufficient documentation

## 2021-04-12 DIAGNOSIS — I10 Essential (primary) hypertension: Secondary | ICD-10-CM | POA: Diagnosis not present

## 2021-04-12 DIAGNOSIS — E7800 Pure hypercholesterolemia, unspecified: Secondary | ICD-10-CM | POA: Insufficient documentation

## 2021-04-12 DIAGNOSIS — I700 Atherosclerosis of aorta: Secondary | ICD-10-CM | POA: Insufficient documentation

## 2021-04-12 DIAGNOSIS — K760 Fatty (change of) liver, not elsewhere classified: Secondary | ICD-10-CM | POA: Insufficient documentation

## 2021-04-12 DIAGNOSIS — Z79899 Other long term (current) drug therapy: Secondary | ICD-10-CM | POA: Insufficient documentation

## 2021-04-12 HISTORY — PX: RADIOFREQUENCY ABLATION: SHX2290

## 2021-04-12 LAB — GLUCOSE, CAPILLARY
Glucose-Capillary: 189 mg/dL — ABNORMAL HIGH (ref 70–99)
Glucose-Capillary: 139 mg/dL — ABNORMAL HIGH (ref 70–99)

## 2021-04-12 LAB — ABO/RH: ABO/RH(D): O POS

## 2021-04-12 SURGERY — RADIO FREQUENCY ABLATION
Anesthesia: General

## 2021-04-12 MED ORDER — SODIUM CHLORIDE 0.9 % IV SOLN: INTRAVENOUS | Status: DC

## 2021-04-12 MED ORDER — PHENYLEPHRINE 40 MCG/ML (10ML) SYRINGE FOR IV PUSH (FOR BLOOD PRESSURE SUPPORT)
PREFILLED_SYRINGE | INTRAVENOUS | Status: DC | PRN
  Administered 2021-04-12 (×3): 160 ug via INTRAVENOUS

## 2021-04-12 MED ORDER — PHENYLEPHRINE HCL-NACL 20-0.9 MG/250ML-% IV SOLN
INTRAVENOUS | Status: AC
  Filled 2021-04-12: qty 250

## 2021-04-12 MED ORDER — MIDAZOLAM HCL 5 MG/5ML IJ SOLN
INTRAMUSCULAR | Status: DC | PRN
Start: 2021-04-12 — End: 2021-04-12
  Administered 2021-04-12: 2 mg via INTRAVENOUS

## 2021-04-12 MED ORDER — LACTATED RINGERS IV SOLN: INTRAVENOUS | Status: DC

## 2021-04-12 MED ORDER — PHENYLEPHRINE HCL-NACL 20-0.9 MG/250ML-% IV SOLN
INTRAVENOUS | Status: DC | PRN
  Administered 2021-04-12: 50 ug/min via INTRAVENOUS

## 2021-04-12 MED ORDER — SODIUM CHLORIDE 0.9 % IV SOLN
2.0000 g | Freq: Once | INTRAVENOUS | Status: AC
  Administered 2021-04-12: 2 g via INTRAVENOUS
  Filled 2021-04-12: qty 2

## 2021-04-12 MED ORDER — SODIUM CHLORIDE 0.9 % IV SOLN: 2.0000 g | INTRAVENOUS | Status: DC

## 2021-04-12 MED ORDER — MIDAZOLAM HCL 2 MG/2ML IJ SOLN
INTRAMUSCULAR | Status: AC
  Filled 2021-04-12: qty 2

## 2021-04-12 MED ORDER — ORAL CARE MOUTH RINSE: 15.0000 mL | Freq: Once | OROMUCOSAL | Status: AC

## 2021-04-12 MED ORDER — FENTANYL CITRATE PF 50 MCG/ML IJ SOSY: 25.0000 ug | PREFILLED_SYRINGE | INTRAMUSCULAR | Status: DC | PRN

## 2021-04-12 MED ORDER — SUGAMMADEX SODIUM 200 MG/2ML IV SOLN
INTRAVENOUS | Status: DC | PRN
  Administered 2021-04-12: 400 mg via INTRAVENOUS

## 2021-04-12 MED ORDER — ONDANSETRON HCL 4 MG/2ML IJ SOLN
INTRAMUSCULAR | Status: DC | PRN
  Administered 2021-04-12: 4 mg via INTRAVENOUS

## 2021-04-12 MED ORDER — LIDOCAINE 2% (20 MG/ML) 5 ML SYRINGE
INTRAMUSCULAR | Status: DC | PRN
  Administered 2021-04-12: 60 mg via INTRAVENOUS

## 2021-04-12 MED ORDER — CHLORHEXIDINE GLUCONATE 0.12 % MT SOLN
15.0000 mL | Freq: Once | OROMUCOSAL | Status: AC
  Administered 2021-04-12: 15 mL via OROMUCOSAL
  Filled 2021-04-12: qty 15

## 2021-04-12 MED ORDER — FENTANYL CITRATE (PF) 250 MCG/5ML IJ SOLN
INTRAMUSCULAR | Status: AC
  Filled 2021-04-12: qty 5

## 2021-04-12 MED ORDER — HYDROCODONE-ACETAMINOPHEN 5-325 MG PO TABS: 1.0000 | ORAL_TABLET | ORAL | 0 refills | Status: DC | PRN

## 2021-04-12 MED ORDER — FENTANYL CITRATE (PF) 250 MCG/5ML IJ SOLN
INTRAMUSCULAR | Status: DC | PRN
  Administered 2021-04-12: 100 ug via INTRAVENOUS

## 2021-04-12 MED ORDER — ROCURONIUM BROMIDE 10 MG/ML (PF) SYRINGE
PREFILLED_SYRINGE | INTRAVENOUS | Status: DC | PRN
  Administered 2021-04-12: 20 mg via INTRAVENOUS
  Administered 2021-04-12: 60 mg via INTRAVENOUS

## 2021-04-12 MED ORDER — PROPOFOL 10 MG/ML IV BOLUS
INTRAVENOUS | Status: DC | PRN
  Administered 2021-04-12: 160 mg via INTRAVENOUS

## 2021-04-12 NOTE — Transfer of Care (Signed)
Immediate Anesthesia Transfer of Care Note ? ?Patient: Ronald Flores ? ?Procedure(s) Performed: MICROWAVE ABLATION ? ?Patient Location: PACU ? ?Anesthesia Type:General ? ?Level of Consciousness: awake, alert  and oriented ? ?Airway & Oxygen Therapy: Patient Spontanous Breathing and Patient connected to face mask oxygen ? ?Post-op Assessment: Report given to RN and Post -op Vital signs reviewed and stable ? ?Post vital signs: Reviewed and stable ? ?Last Vitals:  ?Vitals Value Taken Time  ?BP 196/90 04/12/21 1100  ?Temp    ?Pulse 67 04/12/21 1100  ?Resp 14 04/12/21 1100  ?SpO2 100 % 04/12/21 1100  ?Vitals shown include unvalidated device data. ? ?Last Pain:  ?Vitals:  ? 04/12/21 0715  ?TempSrc:   ?PainSc: 0-No pain  ?   ? ?  ? ?Complications: No notable events documented. ?

## 2021-04-12 NOTE — Procedures (Signed)
Interventional Radiology Procedure: ? ? ?Indications: Solitary hepatocellular carcinoma ? ?Procedure: [place] and CT guided microwave ablation of right hepatic lesion ? ?Findings: Two PR Neuwave probes placed in hepatic lesion.  5 minute treatment.   ? ?Complications: No immediate complications noted. ?    ?EBL: Minimal ? ?Plan: PACU for recovery and anticipate discharge to home later today.   ? ? ?Rhyatt Muska R. Anselm Pancoast, MD  ?Pager: [phone] ? ? ? ?  ?

## 2021-04-12 NOTE — Anesthesia Procedure Notes (Signed)
Procedure Name: Intubation ?Date/Time: 04/12/2021 9:27 AM ?Performed by: Talbot Grumbling, CRNA ?Pre-anesthesia Checklist: Patient identified, Emergency Drugs available, Suction available and Patient being monitored ?Patient Re-evaluated:Patient Re-evaluated prior to induction ?Oxygen Delivery Method: Circle system utilized ?Preoxygenation: Pre-oxygenation with 100% oxygen ?Induction Type: IV induction ?Ventilation: Mask ventilation without difficulty ?Laryngoscope Size: Mac and 3 ?Grade View: Grade I ?Tube type: Oral ?Tube size: 7.5 mm ?Number of attempts: 1 ?Airway Equipment and Method: Stylet ?Placement Confirmation: ETT inserted through vocal cords under direct vision, positive ETCO2 and breath sounds checked- equal and bilateral ?Secured at: 22 cm ?Tube secured with: Tape ?Dental Injury: Teeth and Oropharynx as per pre-operative assessment  ? ? ? ? ?

## 2021-04-12 NOTE — Progress Notes (Signed)
?  Patient seen by Dr. Anselm Pancoast and me in PACU. ? ?Eating and doing well. Little to no pain. ? ?Ok to D/C home. ? ?Will arrange f/u, our clinic will call patient with details. ? ?Murrell Redden PA-C ?04/12/2021 ?2:53 PM ? ? ?  ?

## 2021-04-12 NOTE — Anesthesia Postprocedure Evaluation (Signed)
Anesthesia Post Note ? ?Patient: Ronald Flores ? ?Procedure(s) Performed: MICROWAVE ABLATION ? ?  ? ?Patient location during evaluation: PACU ?Anesthesia Type: General ?Level of consciousness: awake and alert ?Pain management: pain level controlled ?Vital Signs Assessment: post-procedure vital signs reviewed and stable ?Respiratory status: spontaneous breathing, nonlabored ventilation, respiratory function stable and patient connected to nasal cannula oxygen ?Cardiovascular status: blood pressure returned to baseline and stable ?Postop Assessment: no apparent nausea or vomiting ?Anesthetic complications: no ? ? ?No notable events documented. ? ?Last Vitals:  ?Vitals:  ? 04/12/21 1130 04/12/21 1200  ?BP: (!) 149/73 (!) 142/86  ?Pulse: 60 62  ?Resp: 13 12  ?Temp: (!) 36.4 ?C 36.4 ?C  ?SpO2: 95% 96%  ?  ?Last Pain:  ?Vitals:  ? 04/12/21 1200  ?TempSrc:   ?PainSc: 0-No pain  ? ? ?  ?  ?  ?  ?  ?  ? ?Ronald Flores ? ? ? ? ?

## 2021-04-13 ENCOUNTER — Encounter (HOSPITAL_COMMUNITY): Payer: Self-pay | Admitting: Diagnostic Radiology

## 2021-04-13 ENCOUNTER — Telehealth: Payer: Self-pay

## 2021-04-13 NOTE — Telephone Encounter (Signed)
Dr Bobby Rumpf: both should be ok. ? ?Shey Yott,RN : Pt had ablation yesterday for liver cancer w/Dr Myrle Sheng (per his wife). Pt asking if it is ok for him to have eye injection next week? Also, is it ok for him to have his COVID injections? ?

## 2021-04-25 ENCOUNTER — Other Ambulatory Visit: Payer: Self-pay | Admitting: Nurse Practitioner

## 2021-04-25 DIAGNOSIS — R918 Other nonspecific abnormal finding of lung field: Secondary | ICD-10-CM

## 2021-05-09 ENCOUNTER — Encounter: Payer: Self-pay | Admitting: *Deleted

## 2021-05-09 ENCOUNTER — Ambulatory Visit
Admission: RE | Admit: 2021-05-09 | Discharge: 2021-05-09 | Disposition: A | Discharge status: Home / Self Care | Payer: Medicare Other | Source: Ambulatory Visit | Attending: Physician Assistant | Admitting: Physician Assistant

## 2021-05-09 DIAGNOSIS — C220 Liver cell carcinoma: Secondary | ICD-10-CM

## 2021-05-09 HISTORY — PX: IR RADIOLOGIST EVAL & MGMT: IMG5224

## 2021-05-09 NOTE — Progress Notes (Signed)
? ? ?Chief Complaint: ?Patient was consulted remotely today (TeleHealth) for hepatocellular carcinoma follow-up ? ?Referring Physician(s): ?Bobby Rumpf, [place] ? ?History of Present Illness: ?Ronald Flores is a 71 y.o. male with biopsy-proven hepatocellular carcinoma.  Patient underwent image guided microwave ablation of the right hepatic lesion on 04/12/2021.  The lesion was treated with 2 Neuwave PR probes and discharged the same day of the procedure.  Patient had bilateral shoulder pain after the procedure which may have been related to his positioning during the procedure.  The shoulder pain has slowly resolved.  He denies abdominal pain, change in appetite, fevers, chills or change in his bowel habits.  He has fatigue and tiredness but this started prior to his treatment.  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 ? ?Past Medical History:  ?Diagnosis Date  ? Abdominal pain   ? DM (diabetes mellitus) ([place])   ? Heart murmur   ? as a child  ? High blood pressure   ? History of kidney stones   ? Hypercholesteremia   ? Kidney stones   ? Loss of appetite   ? Nausea   ? ? ?Past Surgical History:  ?Procedure Laterality Date  ? CATARACT EXTRACTION  05/15/2016  ? COLONOSCOPY    ? Colonic polyp status post polypectomg. Mild sigmoid diverticulosis  ? CYSTOSCOPY W/ URETEROSCOPY W/ LITHOTRIPSY    ? RADIOFREQUENCY ABLATION N/A 04/12/2021  ? Procedure: MICROWAVE ABLATION;  Surgeon: Markus Daft, MD;  Location: [facility];  Service: Anesthesiology;  Laterality: N/A;  ? WISDOM TOOTH EXTRACTION    ? ? ?Allergies: ?Patient has no known allergies. ? ?Medications: ?Prior to Admission medications   ?Medication Sig Start Date End Date Taking? Authorizing Provider  ?Exenatide ER 2 MG PEN Inject 2 mg into the skin every 7 (seven) days.    [provider]  ?hydrALAZINE (APRESOLINE) 25 MG tablet Take 25 mg by mouth 2 (two) times daily. 11/17/20   [provider]  ?HYDROcodone-acetaminophen (NORCO) 5-325 MG tablet Take 1 tablet by mouth every 4 (four) hours as needed for up to 5 doses for severe pain. 04/12/21   Ardis Rowan, PA-C  ?lisinopril (ZESTRIL) 20 MG tablet Take 40 mg by mouth in the morning.    [provider]  ?metFORMIN (GLUCOPHAGE) 1000 MG tablet Take 1,000 mg by mouth 2 (two) times daily with a meal.    [provider]  ?tamsulosin (FLOMAX) 0.4 MG CAPS capsule 0.4 mg every evening. 02/05/21   [provider]  ?  ? ?Family History  ?Problem Relation Age of Onset  ? Breast cancer Mother   ? Liver cancer Brother   ? Colon cancer Neg Hx   ? Esophageal cancer Neg Hx   ? ? ?Social History  ? ?Socioeconomic History  ? Marital status: Unknown  ?  Spouse name: Not on file  ? Number of children: Not on file  ? Years of education: Not on file  ? Highest education level: Not on file  ?Occupational History  ? Not on file  ?Tobacco Use  ? Smoking status: Never  ? Smokeless tobacco: Never  ?Vaping Use  ? Vaping Use: Never used  ?Substance and Sexual Activity  ? Alcohol use: Never  ? Drug use: Never  ? Sexual activity: Not on file  ?Other Topics Concern  ? Not on file  ?Social History Narrative  ? Not on file  ? ?  Social Determinants of Health  ? ?Financial Resource Strain: Not on file  ?Food Insecurity: Not on file  ?Transportation Needs: Not on file  ?Physical Activity: Not on file  ?Stress: Not on file  ?Social Connections: Not on file  ? ? ?ECOG Status: ?1 - Symptomatic but completely ambulatory ? ?Review of Systems  ?Constitutional:  Positive for fatigue. Negative for chills and fever.  ?Gastrointestinal: Negative.   ?Musculoskeletal:   ?     Bilateral shoulder pain  ? ? ?Physical Exam ?No direct physical exam was performed  ? ?Vital Signs: ?There were no vitals taken for this visit. ? ?Imaging: ?DG Chest 1 View ? ?Result Date: 04/12/2021 ?CLINICAL DATA:  Preop for ablation of hepatocellular carcinoma. No chest complaints. EXAM:  CHEST  1 VIEW COMPARISON:  Chest CT of 2 days ago FINDINGS: Midline trachea. Normal heart size and mediastinal contours. No pleural effusion or pneumothorax. The right middle lobe volume loss is only subtly apparent. Left lung is clear. IMPRESSION: No acute cardiopulmonary disease. Right middle lobe volume loss detailed on CT is only subtly apparent. The bronchus intermedius probable polyp is not plain film evident. Electronically Signed   By: Abigail Miyamoto M.D.   On: 04/12/2021 07:43  ? ?CT CHEST W CONTRAST ? ?Result Date: 04/10/2021 ?CLINICAL DATA:  Hepatocellular carcinoma diagnosed earlier this month. Staging. Preop. * onc * EXAM: CT CHEST WITH CONTRAST TECHNIQUE: Multidetector CT imaging of the chest was performed during intravenous contrast administration. RADIATION DOSE REDUCTION: This exam was performed according to the departmental dose-optimization program which includes automated exposure control, adjustment of the mA and/or kV according to patient size and/or use of iterative reconstruction technique. CONTRAST:  46m ISOVUE-300 IOPAMIDOL (ISOVUE-300) INJECTION 61% COMPARISON:  10/28/2009 chest CT.  Abdominal MRI of 02/07/2021. FINDINGS: Cardiovascular: Aortic atherosclerosis. Tortuous thoracic aorta. Normal heart size, without pericardial effusion. No central pulmonary embolism, on this non-dedicated study. Mediastinum/Nodes: No supraclavicular adenopathy. No mediastinal or hilar adenopathy. Subtle esophageal fluid level including on 78/2. Lungs/Pleura: No pleural fluid. Chronic posterior right middle lobe volume loss and cylindrical bronchiectasis, likely secondary to remote infection or inflammation. Apparent nondependent endobronchial nodule of 6 mm within the bronchus intermedius on 74/8 and coronal image 85. New since 2011. Posterior right upper lobe calcified granuloma. 2 mm left upper lobe pulmonary nodule on 73/8, not apparent on prior. Minimal volume loss in the posterior lingula. 3 mm nodule along  the left major fissure on 79/8 is likely a subpleural lymph node. Posterior right upper lobe 3 mm nodule on 33/8 is favored to be present on the prior. Upper Abdomen: Hepatic steatosis. Right hepatic lobe lesion has been previously characterized and is grossly similar at 1.8 cm on 137/2. Adjacent perfusion anomaly. Normal imaged portions of the spleen, stomach, pancreas, gallbladder, adrenal glands, kidneys. Musculoskeletal: No acute osseous abnormality. IMPRESSION: 1. No typical findings of metastatic disease within the chest. 2. Scattered tiny bilateral pulmonary nodules, most likely benign. Consider chest CT follow-up at 6 months. 3. Chronic posterior right middle lobe volume loss and bronchiectasis. Bronchus intermedius 6 mm soft tissue density, suspicious for polyp. Unlikely to represent metastatic disease. Potential clinical strategies include chest CT follow-up at 6-12 weeks (to confirm persistence and exclude less likely retained secretions) versus more aggressive evaluation with bronchoscopy or PET. 4. Grossly similar size of the hypervascular liver lesion compared to 02/07/2021 dedicated MRI. 5.  Aortic Atherosclerosis (ICD10-I70.0). 6. Esophageal air fluid level suggests dysmotility or gastroesophageal reflux. Electronically Signed   By: KAbigail Miyamoto  M.D.   On: 04/10/2021 16:52  ? ?CT GUIDE TISSUE ABLATION ? ?Result Date: 04/12/2021 ?INDICATION: 71 year old with biopsy-proven hepatocellular carcinoma. Plan for image guided microwave ablation of the lesion. EXAM: IMAGE GUIDED MICROWAVE ABLATION OF RIGHT HEPATIC LESION USING ULTRASOUND AND CT GUIDANCE COMPARISON:  02/27/2021 MEDICATIONS: Cefoxitin 2 g; The antibiotic was administered in an appropriate time interval prior to needle puncture of the skin. ANESTHESIA/SEDATION: General - as administered by the Anesthesia department FLUOROSCOPY: No fluoroscopy COMPLICATIONS: None immediate. TECHNIQUE: Informed written consent was obtained from the patient after a  thorough discussion of the procedural risks, benefits and alternatives. All questions were addressed. Patient was intubated in the CT suite and placed under general anesthesia. A timeout was performed prior to

## 2021-05-29 ENCOUNTER — Ambulatory Visit
Admission: RE | Admit: 2021-05-29 | Discharge: 2021-05-29 | Disposition: A | Discharge status: Home / Self Care | Payer: Medicare Other | Source: Ambulatory Visit | Attending: Nurse Practitioner | Admitting: Nurse Practitioner

## 2021-05-29 DIAGNOSIS — R918 Other nonspecific abnormal finding of lung field: Secondary | ICD-10-CM

## 2021-05-29 MED ORDER — IOPAMIDOL (ISOVUE-300) INJECTION 61%
75.0000 mL | Freq: Once | INTRAVENOUS | Status: AC | PRN
  Administered 2021-05-29: 75 mL via INTRAVENOUS

## 2021-06-04 NOTE — Progress Notes (Signed)
?[facility]  ?[address] ?[place],  [place]  [address] ?[phone] ? ?Clinic Day:  06/05/2021 ? ?Referring physician: [facility]* ? ?HISTORY OF PRESENT ILLNESS:  ?The patient is a 71 y.o. male  with hepatocellular carcinoma.  A biopsy of right liver lesion revealed well-differentiated hepatocellular carcinoma.  As there was no evidence of any metastatic or more widespread localized disease, the patient underwent microwave ablation therapy in late February 2023.  He has had scans done since then which have shown an adequate response to therapy.  Of note, he is scheduled to see interventional radiology in the forthcoming months to have repeat scans done.  Since his last visit, the patient has been doing well.  He claims to have tolerated his microwave ablation technique therapy very well.  His only issue today is fatigue, which is likely multifactorial in nature. ? ?PHYSICAL EXAM:  ?Blood pressure (!) 195/91, pulse 73, temperature 97.8 ?F (36.6 ?C), resp. rate 16, height '5\' 4"'$  (1.626 m), weight 156 lb 6.4 oz (70.9 kg), SpO2 98 %. ?Wt Readings from Last 3 Encounters:  ?06/05/21 156 lb 6.4 oz (70.9 kg)  ?04/12/21 157 lb 3 oz (71.3 kg)  ?04/12/21 157 lb 3 oz (71.3 kg)  ? ?Body mass index is 26.85 kg/m?Marland Kitchen ?Performance status (ECOG): 1 - Symptomatic but completely ambulatory ?Physical Exam ?Constitutional:   ?   Appearance: Normal appearance. He is not ill-appearing.  ?HENT:  ?   Mouth/Throat:  ?   Mouth: Mucous membranes are moist.  ?   Pharynx: Oropharynx is clear. No oropharyngeal exudate or posterior oropharyngeal erythema.  ?Cardiovascular:  ?   Rate and Rhythm: Normal rate and regular rhythm.  ?   Heart sounds: No murmur heard. ?  No friction rub. No gallop.  ?Pulmonary:  ?   Effort: Pulmonary effort is normal. No respiratory distress.  ?   Breath sounds: Normal breath sounds. No wheezing, rhonchi or rales.  ?Abdominal:  ?   General: Bowel sounds are normal. There is  no distension.  ?   Palpations: Abdomen is soft. There is no mass.  ?   Tenderness: There is no abdominal tenderness.  ?Musculoskeletal:     ?   General: No swelling.  ?   Right lower leg: No edema.  ?   Left lower leg: No edema.  ?Lymphadenopathy:  ?   Cervical: No cervical adenopathy.  ?   Upper Body:  ?   Right upper body: No supraclavicular or axillary adenopathy.  ?   Left upper body: No supraclavicular or axillary adenopathy.  ?   Lower Body: No right inguinal adenopathy. No left inguinal adenopathy.  ?Skin: ?   General: Skin is warm.  ?   Coloration: Skin is not jaundiced.  ?   Findings: No lesion or rash.  ?Neurological:  ?   General: No focal deficit present.  ?   Mental Status: He is alert and oriented to person, place, and time. Mental status is at baseline.  ?Psychiatric:     ?   Mood and Affect: Mood normal.     ?   Behavior: Behavior normal.     ?   Thought Content: Thought content normal.  ? ?LABS:  ? ? ?  Latest Ref Rng & Units 06/05/2021  ? 12:00 AM 03/31/2021  ? 12:58 PM 03/06/2021  ? 12:00 AM  ?CBC  ?WBC  5.9      7.0   6.9    ?  Hemoglobin 13.5 - 17.5 14.2      14.1   15.2    ?Hematocrit 41 - 53 44      41.7   45    ?Platelets 150 - 400 K/uL 225      202   222    ?  ? This result is from an external source.  ? ? ?  Latest Ref Rng & Units 06/05/2021  ? 12:00 AM 03/31/2021  ? 12:58 PM 03/06/2021  ? 12:00 AM  ?CMP  ?Glucose 70 - 99 mg/dL  285     ?BUN 4 - '21 15      18   22    '$ ?Creatinine 0.6 - 1.3 0.8      0.89   1.0    ?Sodium 137 - 147 137      133   138    ?Potassium 3.5 - 5.1 mEq/L 4.0      4.1   4.0    ?Chloride 99 - 108 100      99   100    ?CO2 13 - '22 28      26   29    '$ ?Calcium 8.7 - 10.7 9.6      9.1   9.6    ?Total Protein 6.5 - 8.1 g/dL  7.0     ?Total Bilirubin 0.3 - 1.2 mg/dL  0.4     ?Alkaline Phos 25 - 125 81      71   95    ?AST 14 - 40 '20      14   25    '$ ?ALT 10 - 40 U/L '26      21   29    '$ ?  ? This result is from an external source.  ? ? Latest Reference Range & Units 06/05/21 13:31  ?AFP,  Serum, Tumor Marker 0.0 - 8.4 ng/mL 1.9  ? ?STUDIES:  ?CT CHEST W CONTRAST ? ?Result Date: 05/30/2021 ?CLINICAL DATA:  Hepatocellular carcinoma staging, previous microwave ablation of right hepatic lesion EXAM: CT CHEST WITH CONTRAST TECHNIQUE: Multidetector CT imaging of the chest was performed during intravenous contrast administration. RADIATION DOSE REDUCTION: This exam was performed according to the departmental dose-optimization program which includes automated exposure control, adjustment of the mA and/or kV according to patient size and/or use of iterative reconstruction technique. CONTRAST:  66m ISOVUE-300 IOPAMIDOL (ISOVUE-300) INJECTION 61% COMPARISON:  CT done on 04/10/2021 FINDINGS: Cardiovascular: There is homogeneous enhancement in thoracic aorta. There are no filling defects in the central pulmonary artery branches. Few scattered coronary artery calcifications are seen. Mediastinum/Nodes: No significant lymphadenopathy is seen. Lungs/Pleura: There are linear densities in the right middle lobe and lingula with no significant interval change suggesting scarring. There is ectasia of bronchi in the right middle lobe. No new focal pulmonary infiltrates are seen. No discrete lung nodules are seen. There is no pleural effusion or pneumothorax. Upper Abdomen: There is fatty infiltration in the liver. There is 4.8 x 3.1 x 3.3 cm fluid density lesion in the right lobe of liver. In the previous study there was an enhancing lesion in the same region. Findings most likely are related to interval microwave ablation of hepatocellular carcinoma. In the image 347 of series 6, there is an ill-defined 1.5 cm area of subtle increased vascularity in the right lobe of liver, possibly hemangioma or some other space-occupying neoplastic process. Small hiatal hernia is seen. There are few small ill-defined low-density foci in the spleen  which are not optimally characterized. These may suggest cysts or hemangiomas. There are  few slightly enlarged lymph nodes adjacent to porta hepatis. In image 343, there is 11 mm nodule. There is portacaval node measuring 2.4 x 0.9 cm. These nodes have not changed significantly since 04/10/2021. Musculoskeletal: Unremarkable. IMPRESSION: There is no significant lymphadenopathy in the mediastinum. There are no new nodules or infiltrates in the lung fields. There is 4.8 x 3.1 x 3.3 cm smooth marginated fluid density lesion in the right lobe of liver, possibly related to interval microwave ablation of liver lesion. There is subtle focus of high density in the right lobe of liver inferior to the area of ablation which may suggest hemangioma or some other neoplastic process. There are few slightly enlarged lymph nodes adjacent to porta hepatis with no significant interval change since 04/10/2021. Other findings as described in the body of the report. Electronically Signed   By: Elmer Picker M.D.   On: 05/30/2021 15:35  ? ?IR Radiologist Eval & Mgmt ? ?Result Date: 05/09/2021 ?Please refer to notes tab for details about interventional procedure. (Op Note)   ? ?ASSESSMENT & PLAN:  ?A 71 y.o. male with stage IA (T1a N0 M0) hepatocellular carcinoma, who underwent microwave ablation therapy to his isolated liver lesion in late February 2023.  There is nothing per his physical exam or recent scans which have been concerning about possible disease progression.  Of note, interventional radiology is scheduled to see him in another few months to reimage his abdomen to ensure he has had a sustained response to his ablation therapy.  Clinically, the patient is doing well.  As that is the case, I will see him back in 6 months for repeat clinical assessment.  The patient understands all the plans discussed today and is in agreement with them. ? ?Kiyanna Biegler Macarthur Critchley, MD   ? ? ?  ?

## 2021-06-05 ENCOUNTER — Other Ambulatory Visit: Payer: Self-pay | Admitting: Hematology and Oncology

## 2021-06-05 ENCOUNTER — Inpatient Hospital Stay: Payer: Medicare Other

## 2021-06-05 ENCOUNTER — Inpatient Hospital Stay: Payer: Medicare Other | Attending: Oncology | Admitting: Oncology

## 2021-06-05 VITALS — BP 195/91 | HR 73 | Temp 97.8°F | Resp 16 | Ht 64.0 in | Wt 156.4 lb

## 2021-06-05 DIAGNOSIS — C220 Liver cell carcinoma: Secondary | ICD-10-CM | POA: Diagnosis not present

## 2021-06-05 LAB — BASIC METABOLIC PANEL
BUN: 15 (ref 4–21)
CO2: 28 — AB (ref 13–22)
Chloride: 100 (ref 99–108)
Creatinine: 0.8 (ref 0.6–1.3)
Glucose: 256
Potassium: 4 mEq/L (ref 3.5–5.1)
Sodium: 137 (ref 137–147)

## 2021-06-05 LAB — CBC AND DIFFERENTIAL
HCT: 44 (ref 41–53)
Hemoglobin: 14.2 (ref 13.5–17.5)
Neutrophils Absolute: 4.37
Platelets: 225 10*3/uL (ref 150–400)
WBC: 5.9

## 2021-06-05 LAB — HEPATIC FUNCTION PANEL
ALT: 26 U/L (ref 10–40)
AST: 20 (ref 14–40)
Alkaline Phosphatase: 81 (ref 25–125)
Bilirubin, Total: 0.7

## 2021-06-05 LAB — COMPREHENSIVE METABOLIC PANEL
Albumin: 4.6 (ref 3.5–5.0)
Calcium: 9.6 (ref 8.7–10.7)

## 2021-06-05 LAB — CBC
MCV: 87 (ref 80–94)
RBC: 4.98 (ref 3.87–5.11)

## 2021-06-06 LAB — AFP TUMOR MARKER: AFP, Serum, Tumor Marker: 1.9 ng/mL (ref 0.0–8.4)

## 2021-07-25 ENCOUNTER — Other Ambulatory Visit: Payer: Self-pay | Admitting: Diagnostic Radiology

## 2021-07-25 DIAGNOSIS — C220 Liver cell carcinoma: Secondary | ICD-10-CM

## 2021-08-14 ENCOUNTER — Ambulatory Visit (HOSPITAL_COMMUNITY)
Admission: RE | Admit: 2021-08-14 | Discharge: 2021-08-14 | Disposition: A | Discharge status: Home / Self Care | Payer: Medicare Other | Source: Ambulatory Visit | Attending: Diagnostic Radiology | Admitting: Diagnostic Radiology

## 2021-08-14 DIAGNOSIS — C220 Liver cell carcinoma: Secondary | ICD-10-CM | POA: Diagnosis not present

## 2021-08-14 MED ORDER — GADOBUTROL 1 MMOL/ML IV SOLN
7.0000 mL | Freq: Once | INTRAVENOUS | Status: AC | PRN
  Administered 2021-08-14: 7 mL via INTRAVENOUS

## 2021-08-21 ENCOUNTER — Ambulatory Visit
Admission: RE | Admit: 2021-08-21 | Discharge: 2021-08-21 | Disposition: A | Discharge status: Home / Self Care | Payer: Medicare Other | Source: Ambulatory Visit | Attending: Diagnostic Radiology | Admitting: Diagnostic Radiology

## 2021-08-21 ENCOUNTER — Encounter: Payer: Self-pay | Admitting: *Deleted

## 2021-08-21 DIAGNOSIS — C220 Liver cell carcinoma: Secondary | ICD-10-CM

## 2021-08-21 HISTORY — PX: IR RADIOLOGIST EVAL & MGMT: IMG5224

## 2021-08-21 NOTE — Progress Notes (Signed)
Chief Complaint: Patient was consulted remotely today (TeleHealth) for follow-up of hepatocellular carcinoma.  Referring Physician(s): Lavera Guise, MD  History of Present Illness: Ronald Flores is a 71 y.o. male with a biopsy-proven hepatocellular carcinoma in the right hepatic lobe.  Patient underwent image guided microwave ablation of the right hepatic lesion on 04/12/2021.  Patient has been doing well since the procedure although he complains of generalized fatigue and has lost approximately 20 pounds over the last 18 months.  Patient has decreased appetite.  He has no significant abdominal pain or abdominal symptoms.  Patient is currently on vacation in [place].  Patient had a follow-up MRI on 08/14/2021.  Past Medical History:  Diagnosis Date   Abdominal pain    DM (diabetes mellitus) ([facility])    Heart murmur    as a child   High blood pressure    History of kidney stones    Hypercholesteremia    Kidney stones    Loss of appetite    Nausea     Past Surgical History:  Procedure Laterality Date   CATARACT EXTRACTION  05/15/2016   COLONOSCOPY     Colonic polyp status post polypectomg. Mild sigmoid diverticulosis   CYSTOSCOPY W/ URETEROSCOPY W/ LITHOTRIPSY     IR RADIOLOGIST EVAL & MGMT  05/09/2021   RADIOFREQUENCY ABLATION N/A 04/12/2021   Procedure: MICROWAVE ABLATION;  Surgeon: Markus Daft, MD;  Location: [facility];  Service: Anesthesiology;  Laterality: N/A;   WISDOM TOOTH EXTRACTION      Allergies: Patient has no known allergies.  Medications: Prior to Admission medications   Medication Sig Start Date End Date Taking? Authorizing Provider  Exenatide ER 2 MG PEN Inject 2 mg into the skin every 7 (seven) days.    [provider]  hydrALAZINE (APRESOLINE) 25 MG tablet Take 25 mg by mouth 2 (two) times daily. 11/17/20   [provider]  HYDROcodone-acetaminophen (NORCO) 5-325 MG tablet Take 1 tablet by mouth every 4 (four) hours as needed for up to 5  doses for severe pain. 04/12/21   Ardis Rowan, PA-C  lisinopril (ZESTRIL) 20 MG tablet Take 40 mg by mouth in the morning.    [provider]  metFORMIN (GLUCOPHAGE) 1000 MG tablet Take 1,000 mg by mouth 2 (two) times daily with a meal.    [provider]  tamsulosin (FLOMAX) 0.4 MG CAPS capsule 0.4 mg every evening. 02/05/21   [provider]     Family History  Problem Relation Age of Onset   Breast cancer Mother    Liver cancer Brother    Colon cancer Neg Hx    Esophageal cancer Neg Hx     Social History   Socioeconomic History   Marital status: Unknown    Spouse name: Not on file   Number of children: Not on file   Years of education: Not on file   Highest education level: Not on file  Occupational History   Not on file  Tobacco Use   Smoking status: Never   Smokeless tobacco: Never  Vaping Use   Vaping Use: Never used  Substance and Sexual Activity   Alcohol use: Never   Drug use: Never   Sexual activity: Not on file  Other Topics Concern   Not on file  Social History Narrative   Not on file   Social Determinants of Health   Financial Resource Strain: Not on file  Food Insecurity: Not on file  Transportation Needs: Not on file  Physical Activity: Not on file  Stress: Not on file  Social Connections: Not on file    Review of Systems  Constitutional:  Positive for fatigue and unexpected weight change.  Gastrointestinal: Negative.     Advance Care Plan: Not discussed during this visit   Physical Exam No direct physical exam was performed    Vital Signs: There were no vitals taken for this visit.  Imaging: MR ABDOMEN WWO CONTRAST  Result Date: 08/15/2021 CLINICAL DATA:  Follow-up hepatocellular carcinoma. Status post microwave ablation. EXAM: MRI ABDOMEN WITHOUT AND WITH CONTRAST TECHNIQUE: Multiplanar multisequence MR imaging of the abdomen was performed both before and after the administration of intravenous contrast.  CONTRAST:  87m GADAVIST GADOBUTROL 1 MMOL/ML IV SOLN COMPARISON:  02/07/2021 FINDINGS: Lower chest: No acute findings. Hepatobiliary: Hepatic steatosis again demonstrated. Microwave defect is seen in the anterior segment of the right hepatic lobe which measures 3.9 x 2.8 cm. This shows mild peripheral rim enhancement, however there is no evidence of residual enhancing mass. A 9 mm T1 and T2 hypointense lesion is again seen in the lateral liver dome which remains unchanged and consistent with benign etiology. No suspicious hepatic masses are identified. Gallbladder is unremarkable. No evidence of biliary ductal dilatation. Pancreas:  No mass or inflammatory changes. Spleen:  Unremarkable.  No evidence of splenomegaly. Adrenals/Urinary Tract: No masses identified. No evidence of hydronephrosis. Stomach/Bowel: Unremarkable. Vascular/Lymphatic: No pathologically enlarged lymph nodes identified. No acute vascular findings. Other:  None. Musculoskeletal:  No suspicious bone lesions identified. IMPRESSION: Ablation defect in the anterior segment of the right hepatic lobe. No evidence of residual carcinoma at this site. No evidence of abdominal metastatic disease. Hepatic steatosis. Electronically Signed   By: JMarlaine HindM.D.   On: 08/15/2021 14:11    Labs:  CBC: Recent Labs    02/27/21 1123 03/06/21 0000 03/31/21 1258 06/05/21 0000  WBC 5.4 6.9 7.0 5.9  HGB 14.7 15.2 14.1 14.2  HCT 43.0 45 41.7 44  PLT 212 222 202 225    COAGS: Recent Labs    01/09/21 1443 02/16/21 1459 02/27/21 1123 03/31/21 1258  INR 0.9 0.9 0.9 0.9    BMP: Recent Labs    01/09/21 1443 02/16/21 1459 03/06/21 0000 03/31/21 1258 06/05/21 0000  NA 138 137 138 133* 137  K 4.2 4.2 4.0 4.1 4.0  CL 103 101 100 99 100  CO2 29 29 29* 26 28*  GLUCOSE 172* 194*  --  285*  --   BUN 22 19 22* 18 15  CALCIUM 9.8 9.4 9.6 9.1 9.6  CREATININE 1.07 0.93 1.0 0.89 0.8  GFRNONAA  --   --   --  >60  --     LIVER FUNCTION  TESTS: Recent Labs    01/09/21 1443 02/16/21 1459 03/06/21 0000 03/31/21 1258 06/05/21 0000  BILITOT 0.5 0.6  --  0.4  --   AST '13 12 25 '$ 14* 20  ALT '18 17 29 21 26  '$ ALKPHOS 79 66 95 71 81  PROT 7.0 6.9  --  7.0  --   ALBUMIN 4.5 4.4 4.6 4.1 4.6    TUMOR MARKERS: Recent Labs    01/09/21 1443 02/16/21 1459  AFPTM 2.8 2.6  CEA  --  2.2    Assessment and Plan:  71year old with a solitary right hepatic hepatocellular carcinoma.  The lesion was treated with image guided cryoablation on 04/12/2021.  Patient had recent follow-up imaging on 08/14/2021 and I personally reviewed the imaging findings.  There is an expected ablation defect in the right hepatic lobe.  The ablation defect extends to the liver capsule.  There is no evidence for residual or recurrent disease at the ablation site.  No new suspicious hepatic lesions identified.  At this time, the solitary hepatic lesion appears to be adequately treated and there is no evidence for recurrent disease or new lesions.  Plan for continued surveillance for new or residual disease.  We will plan for a follow-up MRI of the abdomen, with and without contrast, in 4 months.  Electronically Signed: Burman Riis 08/21/2021, 10:44 AM   I spent a total of    5 Minutes in remote  clinical consultation, greater than 50% of which was counseling/coordinating care for treated hepatocellular carcinoma.  Visit type: Audio only (telephone). Audio (no video) only due to technical limitations.. Alternative for in-person consultation at [facility], [address], [place], [place]. This visit type was conducted due to national recommendations for restrictions regarding the COVID-19 Pandemic (e.g. social distancing).  This format is felt to be most appropriate for this patient at this time.  All issues noted in this document were discussed and addressed.   Patient ID: Ronald Flores, male   DOB: 31-Mar-1950, 71 y.o.   MRN: [id number]

## 2021-09-01 ENCOUNTER — Telehealth: Payer: Medicare Other

## 2021-11-14 ENCOUNTER — Other Ambulatory Visit: Payer: Self-pay | Admitting: Diagnostic Radiology

## 2021-11-14 DIAGNOSIS — C220 Liver cell carcinoma: Secondary | ICD-10-CM

## 2021-12-18 ENCOUNTER — Ambulatory Visit (HOSPITAL_COMMUNITY)
Admission: RE | Admit: 2021-12-18 | Discharge: 2021-12-18 | Disposition: A | Discharge status: Home / Self Care | Payer: Medicare Other | Source: Ambulatory Visit | Attending: Diagnostic Radiology | Admitting: Diagnostic Radiology

## 2021-12-18 DIAGNOSIS — C220 Liver cell carcinoma: Secondary | ICD-10-CM | POA: Diagnosis present

## 2021-12-18 MED ORDER — GADOBUTROL 1 MMOL/ML IV SOLN
7.0000 mL | Freq: Once | INTRAVENOUS | Status: AC | PRN
  Administered 2021-12-18: 7 mL via INTRAVENOUS

## 2022-01-02 ENCOUNTER — Ambulatory Visit
Admission: RE | Admit: 2022-01-02 | Discharge: 2022-01-02 | Disposition: A | Discharge status: Home / Self Care | Payer: Medicare Other | Source: Ambulatory Visit | Attending: Diagnostic Radiology | Admitting: Diagnostic Radiology

## 2022-01-02 DIAGNOSIS — C220 Liver cell carcinoma: Secondary | ICD-10-CM

## 2022-01-02 NOTE — Progress Notes (Signed)
Patient ID: Ronald Flores, male   DOB: 10/23/1950, 71 y.o.   MRN: [id number]       Chief Complaint: Patient was consulted remotely today ([place]) for follow up of hepatocellular carcinoma.  Referring Physician(s): Lavera Guise, MD   History of Present Illness: Ronald Flores is a 71 y.o. male with a biopsy-proven hepatocellular carcinoma in the right hepatic lobe.  Patient underwent image guided microwave ablation of the right hepatic lesion on 04/12/2021.  Patient is going well and continues to work part-time.  He has decreased appetite and lost weight but he attributes it to COVID and taste changes after COVID.  No denies pain or shortness of breath.  Had follow up MRI on 12/18/21.  Past Medical History:  Diagnosis Date   Abdominal pain    DM (diabetes mellitus) ([place])    Heart murmur    as a child   High blood pressure    History of kidney stones    Hypercholesteremia    Kidney stones    Loss of appetite    Nausea     Past Surgical History:  Procedure Laterality Date   CATARACT EXTRACTION  05/15/2016   COLONOSCOPY     Colonic polyp status post polypectomg. Mild sigmoid diverticulosis   CYSTOSCOPY W/ URETEROSCOPY W/ LITHOTRIPSY     IR RADIOLOGIST EVAL & MGMT  05/09/2021   IR RADIOLOGIST EVAL & MGMT  08/21/2021   RADIOFREQUENCY ABLATION N/A 04/12/2021   Procedure: MICROWAVE ABLATION;  Surgeon: Markus Daft, MD;  Location: [facility];  Service: Anesthesiology;  Laterality: N/A;   WISDOM TOOTH EXTRACTION      Allergies: Patient has no known allergies.  Medications: Prior to Admission medications   Medication Sig Start Date End Date Taking? Authorizing Provider  Exenatide ER 2 MG PEN Inject 2 mg into the skin every 7 (seven) days.    [provider]  hydrALAZINE (APRESOLINE) 25 MG tablet Take 25 mg by mouth 2 (two) times daily. 11/17/20   [provider]  HYDROcodone-acetaminophen (NORCO) 5-325 MG tablet Take 1 tablet by mouth every 4 (four) hours as needed for up  to 5 doses for severe pain. 04/12/21   Ardis Rowan, PA-C  lisinopril (ZESTRIL) 20 MG tablet Take 40 mg by mouth in the morning.    [provider]  metFORMIN (GLUCOPHAGE) 1000 MG tablet Take 1,000 mg by mouth 2 (two) times daily with a meal.    [provider]  tamsulosin (FLOMAX) 0.4 MG CAPS capsule 0.4 mg every evening. 02/05/21   [provider]     Family History  Problem Relation Age of Onset   Breast cancer Mother    Liver cancer Brother    Colon cancer Neg Hx    Esophageal cancer Neg Hx     Social History   Socioeconomic History   Marital status: Unknown    Spouse name: Not on file   Number of children: Not on file   Years of education: Not on file   Highest education level: Not on file  Occupational History   Not on file  Tobacco Use   Smoking status: Never   Smokeless tobacco: Never  Vaping Use   Vaping Use: Never used  Substance and Sexual Activity   Alcohol use: Never   Drug use: Never   Sexual activity: Not on file  Other Topics Concern   Not on file  Social History Narrative   Not on file   Social Determinants of Health   Financial  Resource Strain: Not on file  Food Insecurity: Not on file  Transportation Needs: Not on file  Physical Activity: Not on file  Stress: Not on file  Social Connections: Not on file    ECOG Status: 0 - Asymptomatic  Review of Systems  Constitutional:  Positive for appetite change. Negative for activity change.  Respiratory: Negative.       Physical Exam No direct physical exam was performed   Vital Signs: There were no vitals taken for this visit.  Imaging: MR ABDOMEN WWO CONTRAST  Result Date: 12/19/2021 CLINICAL DATA:  Follow-up hepatocellular carcinoma status post microwave ablation. EXAM: MRI ABDOMEN WITHOUT AND WITH CONTRAST TECHNIQUE: Multiplanar multisequence MR imaging of the abdomen was performed both before and after the administration of intravenous contrast. CONTRAST:   77m GADAVIST GADOBUTROL 1 MMOL/ML IV SOLN COMPARISON:  Multiple priors including most recent MRI August 14, 2021 FINDINGS: Lower chest: Heterogeneous signal in the lung bases commonly reflects atelectasis. Hepatobiliary: Hepatic steatosis. Microwave ablation defect in the peripheral right lobe of the liver measures 3.1 x 2.2 cm on image 11/5 previously 3.8 x 2.7 cm when remeasured for consistency. The lesion demonstrates heterogeneously increased intrinsic T1 signal and is T2 hypointense without suspicious postcontrast enhancement on subtraction imaging or reduced diffusivity. Stable 8 mm T1 and T2 hypointense nonenhancing focus in the dome of the liver on image 11/10. No arterially enhancing hepatic lesions. Gallbladder is unremarkable.  No biliary ductal dilation. Pancreas: No pancreatic ductal dilation or evidence of acute inflammation. Spleen: No splenomegaly. Tiny T2 hyperintense splenic foci demonstrate some peripheral nodular discontinuous enhancement with filling in of the lesion on delayed imaging similar in intensity to background aorta for instance a 6 mm lesion on image 21/21 are stable from prior and compatible with benign splenic hemangiomata. Adrenals/Urinary Tract: Bilateral adrenal glands appear normal. No hydronephrosis. No suspicious renal mass. Stomach/Bowel: Stomach is unremarkable for degree of distension. No pathologic dilation or evidence of acute inflammation involving loops of large or small bowel in the abdomen. Vascular/Lymphatic: Normal caliber abdominal aorta. Portal, splenic and superior mesenteric veins are patent. No pathologically enlarged abdominal lymph nodes. Other:  No significant abdominal free fluid. Musculoskeletal: No suspicious bone lesions identified. IMPRESSION: 1. Decreased size of the ablation defect in the peripheral right lobe of the liver. No evidence of residual/recurrent disease at the site. No new suspicious hepatic lesion. 2. No evidence of abdominal metastatic  disease. 3. Hepatic steatosis. Electronically Signed   By: JDahlia BailiffM.D.   On: 12/19/2021 15:08    Labs:  CBC: Recent Labs    02/27/21 1123 03/06/21 0000 03/31/21 1258 06/05/21 0000  WBC 5.4 6.9 7.0 5.9  HGB 14.7 15.2 14.1 14.2  HCT 43.0 45 41.7 44  PLT 212 222 202 225    COAGS: Recent Labs    01/09/21 1443 02/16/21 1459 02/27/21 1123 03/31/21 1258  INR 0.9 0.9 0.9 0.9    BMP: Recent Labs    01/09/21 1443 02/16/21 1459 03/06/21 0000 03/31/21 1258 06/05/21 0000  NA 138 137 138 133* 137  K 4.2 4.2 4.0 4.1 4.0  CL 103 101 100 99 100  CO2 29 29 29* 26 28*  GLUCOSE 172* 194*  --  285*  --   BUN 22 19 22* 18 15  CALCIUM 9.8 9.4 9.6 9.1 9.6  CREATININE 1.07 0.93 1.0 0.89 0.8  GFRNONAA  --   --   --  >60  --     LIVER FUNCTION TESTS: Recent Labs  01/09/21 1443 02/16/21 1459 03/06/21 0000 03/31/21 1258 06/05/21 0000  BILITOT 0.5 0.6  --  0.4  --   AST '13 12 25 '$ 14* 20  ALT '18 17 29 21 26  '$ ALKPHOS 79 66 95 71 81  PROT 7.0 6.9  --  7.0  --   ALBUMIN 4.5 4.4 4.6 4.1 4.6    TUMOR MARKERS: Recent Labs    01/09/21 1443 02/16/21 1459  AFPTM 2.8 2.6  CEA  --  2.2    Assessment and Plan:  71 yo with a solitary hepatocellular carcinoma in the right hepatic lobe that was treated with microwave ablation.  Recent MRI shows decreased ablation defect and no evidence for residual or recurrent disease.  Patient is doing well without new complaints.  Recommend continued close surveillance at this time.  Plan for follow up abdominal MRI (with and without contrast) in 4 months and repeat AFP blood test in 4 months.    Electronically Signed: Burman Riis 01/02/2022, 3:34 PM   I spent a total of    5 Minutes in remote  clinical consultation, greater than 50% of which was counseling/coordinating care for treated hepatocellular carcinoma.    Visit type: Audio only (telephone). Audio (no video) only due to technical limitations. Alternative for in-person  consultation at [facility], [place] [address], [place], [place]. This visit type was conducted due to national recommendations for restrictions regarding the COVID-19 Pandemic (e.g. social distancing).  This format is felt to be most appropriate for this patient at this time.  All issues noted in this document were discussed and addressed.

## 2022-04-06 ENCOUNTER — Other Ambulatory Visit: Payer: Self-pay | Admitting: Diagnostic Radiology

## 2022-04-06 DIAGNOSIS — C220 Liver cell carcinoma: Secondary | ICD-10-CM

## 2022-05-14 ENCOUNTER — Ambulatory Visit
Admission: RE | Admit: 2022-05-14 | Discharge: 2022-05-14 | Disposition: A | Discharge status: Home / Self Care | Payer: Medicare Other | Source: Ambulatory Visit | Attending: Diagnostic Radiology | Admitting: Diagnostic Radiology

## 2022-05-14 DIAGNOSIS — C220 Liver cell carcinoma: Secondary | ICD-10-CM

## 2022-05-14 NOTE — Progress Notes (Signed)
Patient ID: Ronald MilchRonald Flores, male   DOB: 11/29/1950, 72 y.o.   MRN: [id number]       Chief Complaint: Patient was consulted remotely today ([facility]) for follow-up of treated hepatocellular carcinoma.  Referring Physician(s): Rennis HardingLewis, [place], MD  History of Present Illness: Ronald Flores is a 72 y.o. male with biopsy-proven hepatocellular carcinoma in the right hepatic lobe.  He underwent microwave ablation of the right hepatic lesion on 04/12/2021.  Patient is doing very well and he remains active and working part-time.  He has no specific complaints and he has no pain.  He has been slowly losing some weight related to decreased appetite.  He had an AFP level drawn on 04/25/2022.  The AFP level was 1.9 and this is stable since 06/05/2021.  Patient also had an [facility].  MRI demonstrated the ablated lesion in the hepatic lobe without signs of disease recurrence.  However, there is a potential new lesion in the right hepatic lobe along a portal vein.  This area is indeterminate due to the hepatic steatosis.    Past Medical History:  Diagnosis Date   Abdominal pain    DM (diabetes mellitus) ([facility])    Heart murmur    as a child   High blood pressure    History of kidney stones    Hypercholesteremia    Kidney stones    Loss of appetite    Nausea     Past Surgical History:  Procedure Laterality Date   CATARACT EXTRACTION  05/15/2016   COLONOSCOPY     Colonic polyp status post polypectomg. Mild sigmoid diverticulosis   CYSTOSCOPY W/ URETEROSCOPY W/ LITHOTRIPSY     IR RADIOLOGIST EVAL & MGMT  05/09/2021   IR RADIOLOGIST EVAL & MGMT  08/21/2021   RADIOFREQUENCY ABLATION N/A 04/12/2021   Procedure: MICROWAVE ABLATION;  Surgeon: Richarda OverlieHenn, Atianna Haidar, MD;  Location: [facility];  Service: Anesthesiology;  Laterality: N/A;   WISDOM TOOTH EXTRACTION      Allergies: Patient has no known allergies.  Medications: Prior to Admission medications   Medication Sig Start  Date End Date Taking? Authorizing Provider  Exenatide ER 2 MG PEN Inject 2 mg into the skin every 7 (seven) days.    [provider]  hydrALAZINE (APRESOLINE) 25 MG tablet Take 25 mg by mouth 2 (two) times daily. 11/17/20   [provider]  HYDROcodone-acetaminophen (NORCO) 5-325 MG tablet Take 1 tablet by mouth every 4 (four) hours as needed for up to 5 doses for severe pain. 04/12/21   Gershon CraneBlair, Wendy Sanders, PA-C  lisinopril (ZESTRIL) 20 MG tablet Take 40 mg by mouth in the morning.    [provider]  metFORMIN (GLUCOPHAGE) 1000 MG tablet Take 1,000 mg by mouth 2 (two) times daily with a meal.    [provider]  tamsulosin (FLOMAX) 0.4 MG CAPS capsule 0.4 mg every evening. 02/05/21   [provider]     Family History  Problem Relation Age of Onset   Breast cancer Mother    Liver cancer Brother    Colon cancer Neg Hx    Esophageal cancer Neg Hx     Social History   Socioeconomic History   Marital status: Unknown    Spouse name: Not on file   Number of children: Not on file   Years of education: Not on file   Highest education level: Not on file  Occupational History   Not on file  Tobacco Use  Smoking status: Never   Smokeless tobacco: Never  Vaping Use   Vaping Use: Never used  Substance and Sexual Activity   Alcohol use: Never   Drug use: Never   Sexual activity: Not on file  Other Topics Concern   Not on file  Social History Narrative   Not on file   Social Determinants of Health   Financial Resource Strain: Not on file  Food Insecurity: Not on file  Transportation Needs: Not on file  Physical Activity: Not on file  Stress: Not on file  Social Connections: Not on file    ECOG Status: 0 - Asymptomatic  Review of Systems  Constitutional:  Positive for appetite change. Negative for activity change.     Physical Exam No direct physical exam was performed   Vital Signs: There were no vitals taken for this  visit.  Imaging: MRI ABDOMEN 04/23/2022 CLINICAL DATA: A 72 year old male presents for or follow-up of hepatocellular carcinoma post ablation.  EXAM: MRI ABDOMEN WITHOUT AND WITH CONTRAST  TECHNIQUE: Multiplanar multisequence MR imaging of the abdomen was performed both before and after the administration of intravenous contrast.  CONTRAST: 6.6 mL Gadavist  COMPARISON: December 18, 2021  FINDINGS: Lower chest: Incidental imaging of the lung bases is unremarkable to the extent evaluated on this abdominal MRI. Assessment is limited.  Hepatobiliary: Severe hepatic steatosis. Mild fissural widening of hepatic fissures. No overt signs of cirrhotic morphology.  Ablated lesion in the RIGHT hepatic lobe at the periphery displays intrinsic T1 hyperintensity with hemosiderin at the margin based on T2 hypointensity and mild susceptibility effect seen on in phase imaging. This measures 3.3 x 2.3 cm greatest axial dimension previously 3.4 x 2.6 cm. No signs of internal enhancement to indicate disease recurrence.  Potential new lesion (image 31/800) this is along portal branches in the RIGHT hepatic lobe in hepatic subsegment VIII and may measure as much as 12 mm seen only on precontrast and subtraction imaging, difficult to separate from surrounding vessels best seen on subtraction image 30/10802. This does not display classic features of HCC but shows similar features to previous imaging studies.  Portal vein is patent. Sludge in the gallbladder without pericholecystic stranding or sign of biliary duct dilation. Mildly limited assessment of the periphery of the RIGHT hemiliver due to susceptibility effect.  Pancreas: Normal, without mass, inflammation or ductal dilatation.  Spleen: Normal.  Adrenals/Urinary Tract: Normal.  Stomach/Bowel: Unremarkable to the extent evaluated on this abdominal MRI.  Vascular/Lymphatic: No pathologically enlarged lymph nodes identified. No  abdominal aortic aneurysm demonstrated.  Other: No ascites  Musculoskeletal: No suspicious bone lesions identified.  IMPRESSION: 1. Ablated lesion in the RIGHT hepatic lobe without signs of disease recurrence. 2. Findings that are suspicious for a new lesion along portal tracks in the RIGHT hepatic lobe as discussed. Degree of steatosis limiting assessment. While enhancement pattern is not typical for [facility] it does follow that pattern which was seen on previous imaging. Suggest focused ultrasound evaluation with ultrasound contrast and biopsy as warranted. Given that this may be a new finding this could be categorized as LR 5 given known history of hepatocellular carcinoma. Given that it is faintly seen on MRI. The ultrasound may be helpful due to intrinsic "image contrast" provided secondary to the profound nature of the steatosis. 3. Severe hepatic steatosis. Mild fissural widening of hepatic fissures. No overt signs of cirrhotic morphology. 4. Sludge in the gallbladder without pericholecystic stranding or sign of biliary duct dilation.  Electronically Signed: By: Juliene Pina  Wile M.D. On: 04/25/2022 10:41  ADDENDUM REPORT: 04/26/2022 17:02  ADDENDUM: For clarification, the lesion of concern in the RIGHT hepatic lobe superior to the ablated area does appear to enhance though this enhancement is best seen on subtraction images. Enhancement characteristics are similar to the lesion discovered on more remote imaging which makes this lesion more suspicious. Given history of hepatocellular carcinoma this lesion should be considered suspicious for small HCC.   Electronically Signed By: Donzetta Kohut M.D. On: 04/26/2022 17:02   Labs:  CBC: Recent Labs    06/05/21 0000  WBC 5.9  HGB 14.2  HCT 44  PLT 225    COAGS: No results for input(s): "INR", "APTT" in the last 8760 hours.  BMP: Recent Labs    06/05/21 0000  NA 137  K 4.0  CL 100  CO2 28*  BUN 15  CALCIUM  9.6  CREATININE 0.8    LIVER FUNCTION TESTS: Recent Labs    06/05/21 0000  AST 20  ALT 26  ALKPHOS 81  ALBUMIN 4.6    TUMOR MARKERS: No results for input(s): "AFPTM", "CEA", "CA199", "CHROMGRNA" in the last 8760 hours.  Assessment"  72 year old with history of hepatocellular carcinoma and status post image guided microwave ablation of the lesion.  Patient is essentially asymptomatic.  AFP level remains normal.  Most recent MRI demonstrates no local recurrence at the ablation site but there is a potential new small lesion in the right hepatic lobe near portal vein branches.  I personally reviewed the recent imaging and the small area in the right hepatic lobe is indeterminate.  I discussed the imaging findings with the patient and his wife.  Recommend close surveillance of this potential liver lesion.  We will get a follow-up MRI of the abdomen, with and without contrast, in 3 months.  Plan for follow-up visit after the MRI.     Electronically Signed: Arn Medal 05/14/2022, 2:08 PM   I spent a total of    10 Minutes in remote  clinical consultation, greater than 50% of which was counseling/coordinating care for hepatocellular carcinoma.    Visit type: Audio only (telephone). Audio (no video) only due to patient preference. Alternative for in-person consultation at [facility], [address] [place], [place], [place].

## 2022-09-10 ENCOUNTER — Other Ambulatory Visit: Payer: Self-pay | Admitting: Diagnostic Radiology

## 2022-09-10 DIAGNOSIS — C220 Liver cell carcinoma: Secondary | ICD-10-CM

## 2022-09-19 ENCOUNTER — Encounter: Payer: Self-pay | Admitting: Diagnostic Radiology

## 2022-11-26 ENCOUNTER — Ambulatory Visit
Admission: RE | Admit: 2022-11-26 | Discharge: 2022-11-26 | Disposition: A | Discharge status: Home / Self Care | Payer: Medicare Other | Source: Ambulatory Visit | Attending: Diagnostic Radiology | Admitting: Diagnostic Radiology

## 2022-11-26 DIAGNOSIS — C220 Liver cell carcinoma: Secondary | ICD-10-CM

## 2022-11-27 ENCOUNTER — Ambulatory Visit
Admission: RE | Admit: 2022-11-27 | Discharge: 2022-11-27 | Disposition: A | Discharge status: Home / Self Care | Payer: Medicare Other | Source: Ambulatory Visit | Attending: Diagnostic Radiology | Admitting: Diagnostic Radiology

## 2022-11-27 ENCOUNTER — Other Ambulatory Visit: Payer: Self-pay | Admitting: Diagnostic Radiology

## 2022-11-27 DIAGNOSIS — C220 Liver cell carcinoma: Secondary | ICD-10-CM

## 2022-11-27 NOTE — Progress Notes (Signed)
Chief Complaint: Patient was consulted remotely today (TeleHealth) for treated hepatocellular carcinoma follow-up  Referring Physician(s): Lamar Sprinkles  History of Present Illness: Ronald Flores is a 72 y.o. male with biopsy-proven hepatocellular carcinoma in the right hepatic lobe.  He underwent microwave ablation of the right hepatic lesion on 04/12/2021. Patient has been doing well since we last spoke.  He is remaining active and still working.  He is asymptomatic without complaints.  He had an abdominal MRI on 11/19/22.   Past Medical History:  Diagnosis Date   Abdominal pain    DM (diabetes mellitus) (HCC)    Heart murmur    as a child   High blood pressure    History of kidney stones    Hypercholesteremia    Kidney stones    Loss of appetite    Nausea     Past Surgical History:  Procedure Laterality Date   CATARACT EXTRACTION  05/15/2016   COLONOSCOPY     Colonic polyp status post polypectomg. Mild sigmoid diverticulosis   CYSTOSCOPY W/ URETEROSCOPY W/ LITHOTRIPSY     IR RADIOLOGIST EVAL & MGMT  05/09/2021   IR RADIOLOGIST EVAL & MGMT  08/21/2021   RADIOFREQUENCY ABLATION N/A 04/12/2021   Procedure: MICROWAVE ABLATION;  Surgeon: Richarda Overlie, MD;  Location: [facility];  Service: Anesthesiology;  Laterality: N/A;   WISDOM TOOTH EXTRACTION      Allergies: Patient has no known allergies.  Medications: Prior to Admission medications   Medication Sig Start Date End Date Taking? Authorizing Provider  Exenatide ER 2 MG PEN Inject 2 mg into the skin every 7 (seven) days.    [provider]  hydrALAZINE (APRESOLINE) 25 MG tablet Take 25 mg by mouth 2 (two) times daily. 11/17/20   [provider]  HYDROcodone-acetaminophen (NORCO) 5-325 MG tablet Take 1 tablet by mouth every 4 (four) hours as needed for up to 5 doses for severe pain. 04/12/21   Gershon Crane, PA-C  lisinopril (ZESTRIL) 20 MG tablet Take 40 mg by mouth in the morning.     [provider]  metFORMIN (GLUCOPHAGE) 1000 MG tablet Take 1,000 mg by mouth 2 (two) times daily with a meal.    [provider]  tamsulosin (FLOMAX) 0.4 MG CAPS capsule 0.4 mg every evening. 02/05/21   [provider]     Family History  Problem Relation Age of Onset   Breast cancer Mother    Liver cancer Brother    Colon cancer Neg Hx    Esophageal cancer Neg Hx     Social History   Socioeconomic History   Marital status: Unknown    Spouse name: Not on file   Number of children: Not on file   Years of education: Not on file   Highest education level: Not on file  Occupational History   Not on file  Tobacco Use   Smoking status: Never   Smokeless tobacco: Never  Vaping Use   Vaping status: Never Used  Substance and Sexual Activity   Alcohol use: Never   Drug use: Never   Sexual activity: Not on file  Other Topics Concern   Not on file  Social History Narrative   Not on file   Social Determinants of Health   Financial Resource Strain: Not on file  Food Insecurity: Not on file  Transportation Needs: Not on file  Physical Activity: Not on file  Stress: Not on file  Social Connections: Not on file  ECOG Status: 0 - Asymptomatic  Review of Systems  Constitutional: Negative.   Gastrointestinal: Negative.      Physical Exam No direct physical exam was performed  Vital Signs: There were no vitals taken for this visit.  Imaging:  CLINICAL DATA: Hepatocellular carcinoma restaging, status post ablation  EXAM: MRI ABDOMEN WITHOUT AND WITH CONTRAST  TECHNIQUE: Multiplanar multisequence MR imaging of the abdomen was performed both before and after the administration of intravenous contrast.  CONTRAST: 7 mL Gadavist gadolinium contrast IV  COMPARISON: 04/23/2022  FINDINGS: Lower chest: No acute abnormality.  Hepatobiliary: Severe hepatic steatosis. Unchanged ablation site of the peripheral right lobe of the liver,  measuring 3.2 x 2.4 cm (series 801, image 44). This again demonstrates circumferential postablation hyperemia without suspicious internal contrast enhancement. Previously described potential new lesion in hepatic segment VII is more clearly branching hepatic vasculature on today's examination (series 801, image 39). Transiently enhancing subcentimeter lesions scattered throughout the liver are unchanged, most conspicuously in the posterior inferior right lobe of the liver, hepatic segment VI, measuring as large as 0.7 cm (series 801, image 55). No washout or capsular enhancement. Unchanged T1 and T2 hypointense, nonenhancing lesion of the peripheral liver dome, hepatic segment VII/VIII, poorly assessed on today's examination due to field inhomogeneity artifact in this vicinity, although unchanged over a long period of follow-up and benign (series 804, image 16). Sludge in the gallbladder. No discrete gallstones, gallbladder wall thickening, or biliary dilatation.  Pancreas: Unremarkable. No pancreatic ductal dilatation or surrounding inflammatory changes.  Spleen: Normal in size without significant abnormality. Small benign hemangioma in the inferior portion of the spleen requiring no specific further follow-up or characterization (series 801, image 66).  Adrenals/Urinary Tract: Adrenal glands are unremarkable. Kidneys are normal, without renal calculi, solid lesion, or hydronephrosis.  Stomach/Bowel: Stomach is within normal limits. No evidence of bowel wall thickening, distention, or inflammatory changes.  Vascular/Lymphatic: No significant vascular findings are present. No enlarged abdominal lymph nodes.  Other: No abdominal wall hernia or abnormality. No ascites.  Musculoskeletal: No acute or significant osseous findings.  IMPRESSION: 1. Unchanged ablation site of the peripheral right lobe of the liver. This again demonstrates circumferential postablation hyperemia  without suspicious internal contrast enhancement. LI-RADS TR nonviable. 2. Previously described potential new lesion in hepatic segment VII is more clearly branching hepatic vasculature on today's examination. 3. Transiently enhancing subcentimeter lesions scattered throughout the liver are unchanged, most conspicuously in the posterior inferior right lobe of the liver, hepatic segment VI, measuring as large as 0.7 cm. These may reflect small regenerative or dysplastic foci, or perfusional transient hepatic intensity differences, and remain LI-RADS category 3 lesions, intermediate suspicion for hepatocellular carcinoma. Attention on follow-up. 4. Severe hepatic steatosis. No overtly cirrhotic morphology of the liver. 5. Gallbladder sludge.   Electronically Signed By: Jearld Lesch M.D.   Assessment and Plan:  72 year old male with history of hepatocellular carcinoma and treatment of the solitary lesion with percutaneous microwave ablation.  Patient is asymptomatic and most recent MRI shows stable ablation changes in right hepatic lobe and no new suspicious liver lesions.  There are small indeterminate liver lesions that likely represent regenerative or dysplastic nodules and consistent with LI-RADS 3 lesions. Plan for continued surveillance with another liver MRI (with and without contrast) in 6 months and AFP level at same time.  Will send a copy to patient's GI physician to see if he needs additional follow-up for his hepatic steatosis.   Electronically Signed: Arn Medal  11/27/2022, 2:09 PM   I spent a total of  10 Minutes in remote  clinical consultation, greater than 50% of which was counseling/coordinating care for hepatocellular cancer management and surveillance.    Visit type: Audio only (telephone). Audio (no video) only due to patient preference. Alternative for in-person consultation at [facility].  Patient ID: Ronald Flores, male   DOB: 11-09-1950, 72 y.o.   MRN:  [id number]

## 2023-03-12 IMAGING — CT CT GUIDANCE TISSUE ABLATION
1 of 4 series · 12 of 32 positions shown, 15 images · non-contrast
Comparison: 02/27/2021

INDICATION: 70-year-old with biopsy-proven hepatocellular carcinoma. Plan for
image guided microwave ablation of the lesion.

EXAM:
IMAGE GUIDED MICROWAVE ABLATION OF RIGHT HEPATIC LESION USING
ULTRASOUND AND CT GUIDANCE
TECHNIQUE: Informed written consent was obtained from the patient after a
thorough discussion of the procedural risks, benefits and
alternatives. All questions were addressed. Patient was intubated in
the CT suite and placed under general anesthesia. A timeout was
performed prior to the initiation of the procedure.

[Series 2: i-spiral 2.0 bf37 · axial · 0.83mm/px · z∈[+1243,+1427]mm · 12 of 112 slices shown, 15 images]
[im 10/112  mediastinal]
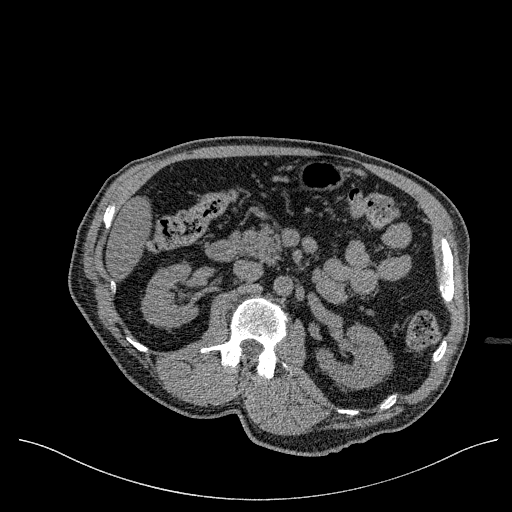
[im 10/112  lung]
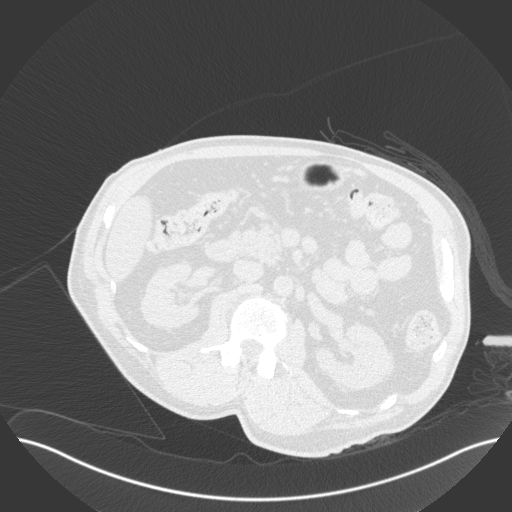
[im 19/112  lung]
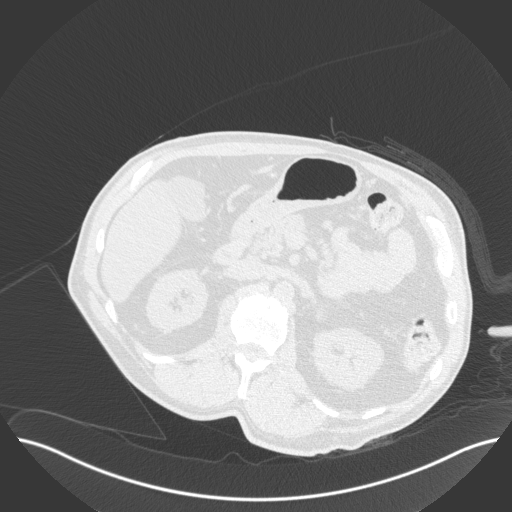
[im 28/112  lung]
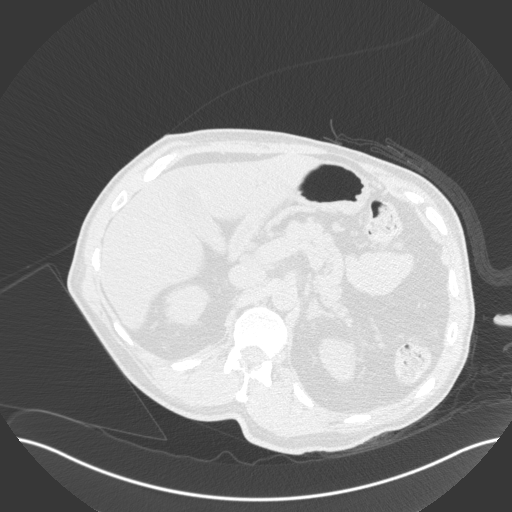
[im 38/112  lung]
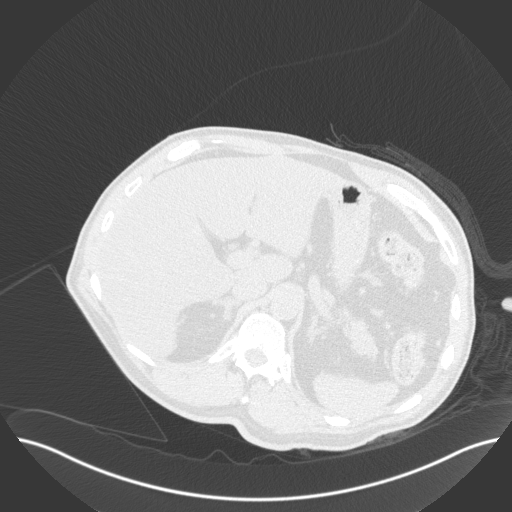
[im 47/112  mediastinal]
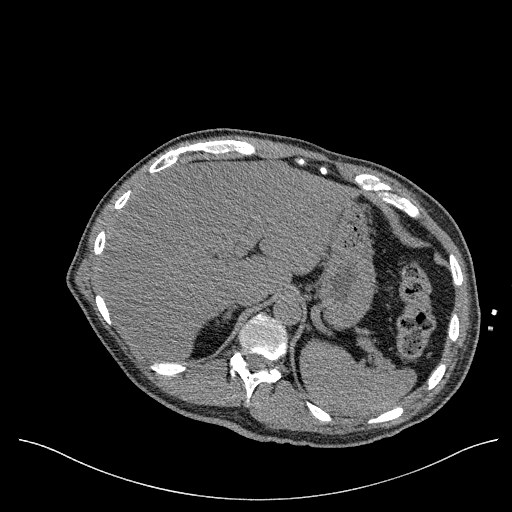
[im 47/112  lung]
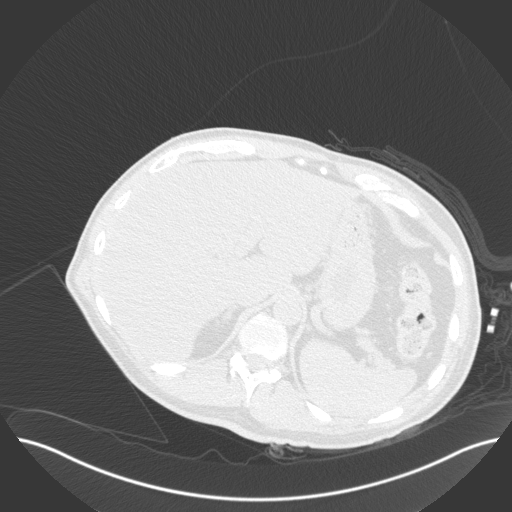
[im 52/112  lung]
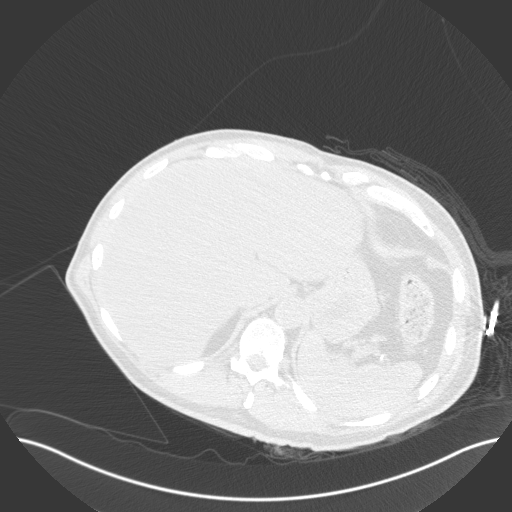
[im 56/112  lung]
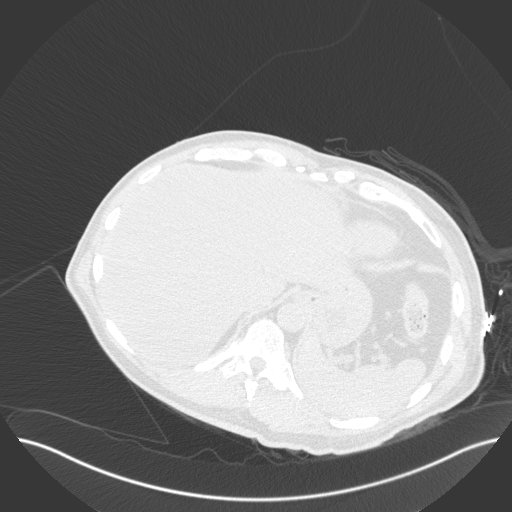
[im 65/112  lung]
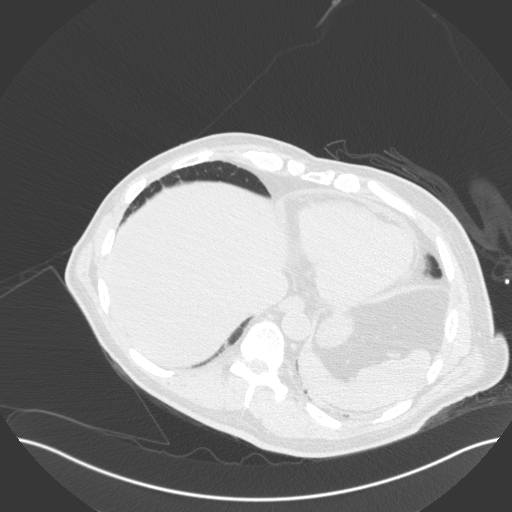
[im 75/112  mediastinal]
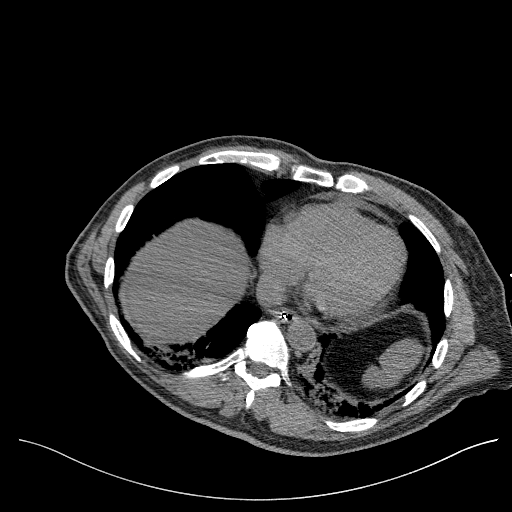
[im 75/112  lung]
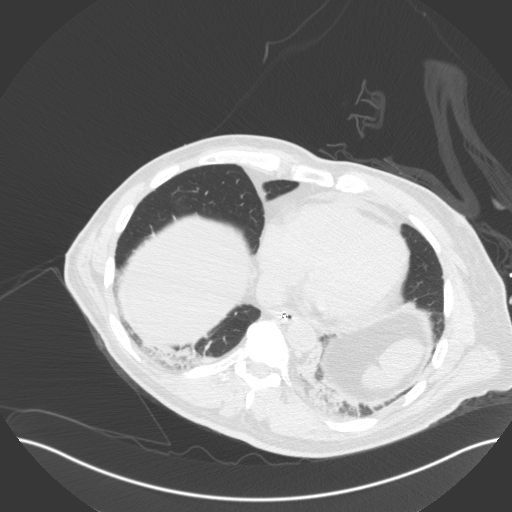
[im 84/112  lung]
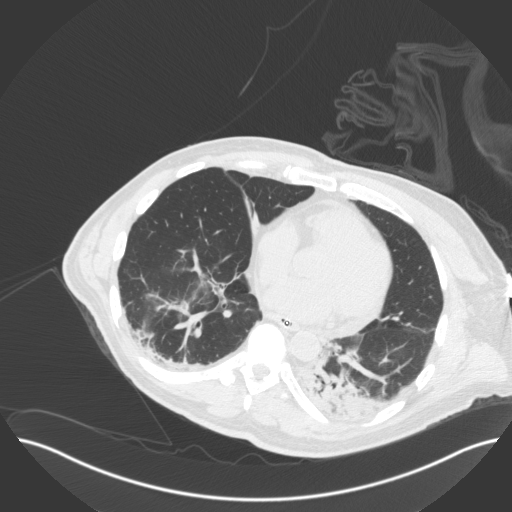
[im 93/112  lung]
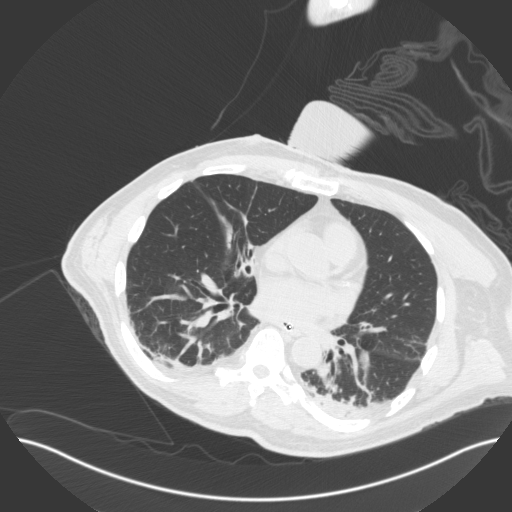
[im 102/112  lung]
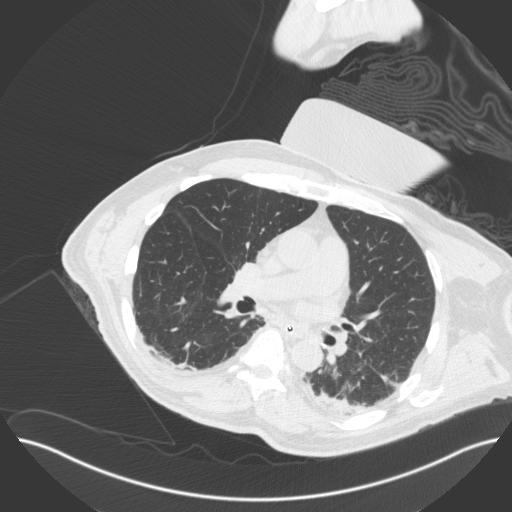

[12 of 32 positions shown; findings below may reference images not displayed]

MEDICATIONS:
Cefoxitin 2 g; The antibiotic was administered in an appropriate
time interval prior to needle puncture of the skin.

ANESTHESIA/SEDATION:
General - as administered by the Anesthesia department

FLUOROSCOPY:
No fluoroscopy

COMPLICATIONS:
None immediate.
The liver was evaluated with ultrasound. The right hepatic lesion
was identified with ultrasound. The right side of the abdomen was
prepped with chlorhexidine and sterile field was created. Maximal
barrier sterile technique was utilized including caps, mask, sterile
gowns, sterile gloves, sterile drape, hand hygiene and skin
antiseptic. CT images through the liver were obtained. Liver lesion
was not visible with on the non contrast CT images. Using ultrasound
guidance, 2 Neuwave PR probes were placed within the lesion. Probes
were placed in a parallel configuration to each other. Both probes
were advanced through the lesion and the tips positioned beyond the
lesion. Probe placement was confirmed with CT and ultrasound. The
lesion was treated for 5 minutes. Both PR probes were removed.
Follow up CT images were obtained. Bandage placed at the puncture
sites.
FINDINGS: Mildly lobulated hypoechoic lesion in the right hepatic measuring
2.0 x 1.6 x 1.6 cm. The microwave probes were placed through the
anterior and posterior aspect of the lesion. Both probes were
advanced beyond the lesion. Lesion was evaluated with ultrasound
during the microwave ablation. Lesion appeared to be adequately
treated based on CT and ultrasound images.
IMPRESSION: Image guided microwave ablation of the hepatocellular carcinoma in
the right hepatic lobe.

## 2023-04-18 ENCOUNTER — Other Ambulatory Visit: Payer: Self-pay | Admitting: Oncology

## 2023-04-18 DIAGNOSIS — C220 Liver cell carcinoma: Secondary | ICD-10-CM

## 2023-04-18 NOTE — Progress Notes (Signed)
 [facility]  [address] [place],  [place]  [address] [phone]  Clinic Day:  04/19/2023  Referring physician: [facility]*  HISTORY OF PRESENT ILLNESS:  The patient is a 73 y.o. male  with stage IA (T1a N0 M0) hepatocellular carcinoma.  As he only had one focus of disease, the patient underwent microwave ablation therapy in late February 2023.  Scans done since then have shown an adequate response to therapy, with no signs of disease progression.  He comes in today for routine follow-up.  Since his last visit, the patient has been doing well.  He denies having any abdominal pain, nausea, or other symptoms which concern him for possible disease progression.  PHYSICAL EXAM:  Blood pressure (!) 171/76, pulse 79, temperature 98 F (36.7 C), temperature source Oral, resp. rate 16, height 5\' 4"  (1.626 m), weight 156 lb 4.8 oz (70.9 kg), SpO2 97%. Wt Readings from Last 3 Encounters:  04/19/23 156 lb 4.8 oz (70.9 kg)  06/05/21 156 lb 6.4 oz (70.9 kg)  04/12/21 157 lb 3 oz (71.3 kg)   Body mass index is 26.83 kg/m. Performance status (ECOG): 1 - Symptomatic but completely ambulatory Physical Exam Constitutional:      Appearance: Normal appearance. He is not ill-appearing.  HENT:     Mouth/Throat:     Mouth: Mucous membranes are moist.     Pharynx: Oropharynx is clear. No oropharyngeal exudate or posterior oropharyngeal erythema.  Cardiovascular:     Rate and Rhythm: Normal rate and regular rhythm.     Heart sounds: No murmur heard.    No friction rub. No gallop.  Pulmonary:     Effort: Pulmonary effort is normal. No respiratory distress.     Breath sounds: Normal breath sounds. No wheezing, rhonchi or rales.  Abdominal:     General: Bowel sounds are normal. There is no distension.     Palpations: Abdomen is soft. There is no mass.     Tenderness: There is no abdominal tenderness.  Musculoskeletal:        General: No swelling.      Right lower leg: No edema.     Left lower leg: No edema.  Lymphadenopathy:     Cervical: No cervical adenopathy.     Upper Body:     Right upper body: No supraclavicular or axillary adenopathy.     Left upper body: No supraclavicular or axillary adenopathy.     Lower Body: No right inguinal adenopathy. No left inguinal adenopathy.  Skin:    General: Skin is warm.     Coloration: Skin is not jaundiced.     Findings: No lesion or rash.  Neurological:     General: No focal deficit present.     Mental Status: He is alert and oriented to person, place, and time. Mental status is at baseline.  Psychiatric:        Mood and Affect: Mood normal.        Behavior: Behavior normal.        Thought Content: Thought content normal.   SCANS:  His abdominal MRI on 04-16-23 revealed the following: FINDINGS: Liver: The patient is status post radiofrequency ablation of a right hepatic malignancy. There is a 3.0 x 2.1 cm lesion in segment VIII with large component T1 signal hyperintensity, T1/T2 hypointense rim, an additional thin, superficial peripheral precontrast T1 rim. The lesion has decreased in size when compared to examination 04/23/2022 (measuring 3.3 x 2.4 cm when  measured in a similar fashion). There is no central enhancement appreciated. Questionable thin peripheral enhancement is suggested. 10 mm wedge-shaped subcapsular T1 hyperintensity without enhancement, consistent with capsular retraction.  Stable 9 mm nonenhancing T1 hypointense lesion not seen on T2 weighted images, unchanged in appearance when compared to examination 04/23/2022, 11/30/2020.  No new lesions are appreciated.  Mild lobular contour of the hepatic parenchyma, particularly evident along the anterior right hepatic lobe.  Diffuse signal dropout on out of phase images consistent with hepatic steatosis.  Few subcapsular foci of early phase arterial enhancement, resolving on portal venous phase best visualized image 44:801,  88:801. This may represent transient hepatic intensity difference.  Biliary: The gallbladder intrahepatic ducts and common bile duct appear normal.  Pancreas: No mass or ductal dilation.  Spleen: Multiple subcentimeter T2 hyperintense foci, likely small cysts or hemangiomas.  Adrenals: No evidence of adrenal mass.  Kidneys: Normal renal sizes without mass or hydronephrosis.  Peritoneum / Retroperitoneum: No ascites.  Lymph Nodes: No upper abdominal lymphadenopathy.  Major Vessels: The abdominal aorta and IVC are unremarkable. The portal and hepatic veins are patent.  Bones: Bone marrow signal is unremarkable.  IMPRESSION: Stable post ablation changes of a right hepatic mass, demonstrating decreasing size when compared to examination 1 year ago. Stable, peripheral circumferential enhancement consistent with post radiation hyperemia. No internal/nodular enhancement identified to suggest lesion recurrence. Subcapsular retraction consistent scarring. There are no new lesions identified on today's examination. Continued follow up is recommended.  Few scattered subcapsular enhancing foci unchanged, possibly representing transient hepatic intensity difference (THID), LI-RADS category 3.  Hepatic steatosis.  LABS:      Latest Ref Rng & Units 04/19/2023    3:25 PM 06/05/2021   12:00 AM 03/31/2021   12:58 PM  CBC  WBC 4.0 - 10.5 K/uL 7.3  5.9     7.0   Hemoglobin 13.0 - 17.0 g/dL 04.5  40.9     81.1   Hematocrit 39.0 - 52.0 % 39.7  44     41.7   Platelets 150 - 400 K/uL 212  225     202      This result is from an external source.      Latest Ref Rng & Units 04/19/2023    3:25 PM 06/05/2021   12:00 AM 03/31/2021   12:58 PM  CMP  Glucose 70 - 99 mg/dL 914   782   BUN 8 - 23 mg/dL 23  15     18    Creatinine 0.61 - 1.24 mg/dL 9.56  0.8     2.13   Sodium 135 - 145 mmol/L 138  137     133   Potassium 3.5 - 5.1 mmol/L 4.4  4.0     4.1   Chloride 98 - 111 mmol/L 103  100     99   CO2 22  - 32 mmol/L 23  28     26    Calcium 8.9 - 10.3 mg/dL 9.4  9.6     9.1   Total Protein 6.5 - 8.1 g/dL 6.7   7.0   Total Bilirubin 0.0 - 1.2 mg/dL 0.4   0.4   Alkaline Phos 38 - 126 U/L 111  81     71   AST 15 - 41 U/L 21  20     14    ALT 0 - 44 U/L 34  26     21      This result is from an external  source.    Latest Reference Range & Units 04/19/23 15:25  AFP, Serum, Tumor Marker 0.0 - 8.4 ng/mL <1.8    ASSESSMENT & PLAN:  A 73 y.o. male with stage IA (T1a N0 M0) hepatocellular carcinoma, who underwent microwave ablation therapy to his isolated liver lesion in late February 2023.  In clinic today, I went over his abdominal MRI images with him, for which he could see that his treated lesion is actually smaller in size.  Overall, I do not get the sense he has any active disease.  Furthermore, he has an undetectable AFP level.  He appears to be disease-free 2 years after his microwave ablation therapy.  Clinically, the patient appears to be doing very well.  Based upon this, he I will see him back in 6 months for repeat clinical assessment.  I will continue to follow him with abdominal MRIs and serial AFP levels to ensure there are no early signs of disease recurrence.  The patient understands all the plans discussed today and is in agreement with them.  Kenyen Candy Kirby Funk, MD

## 2023-04-19 ENCOUNTER — Inpatient Hospital Stay

## 2023-04-19 ENCOUNTER — Inpatient Hospital Stay: Attending: Oncology | Admitting: Oncology

## 2023-04-19 ENCOUNTER — Other Ambulatory Visit: Payer: Self-pay

## 2023-04-19 VITALS — BP 171/76 | HR 79 | Temp 98.0°F | Resp 16 | Ht 64.0 in | Wt 156.3 lb

## 2023-04-19 DIAGNOSIS — C220 Liver cell carcinoma: Secondary | ICD-10-CM | POA: Diagnosis not present

## 2023-04-19 LAB — CBC WITH DIFFERENTIAL (CANCER CENTER ONLY)
Abs Immature Granulocytes: 0.01 10*3/uL (ref 0.00–0.07)
Basophils Absolute: 0 10*3/uL (ref 0.0–0.1)
Basophils Relative: 0 %
Eosinophils Absolute: 0.1 10*3/uL (ref 0.0–0.5)
Eosinophils Relative: 1 %
HCT: 39.7 % (ref 39.0–52.0)
Hemoglobin: 13.7 g/dL (ref 13.0–17.0)
Immature Granulocytes: 0 %
Lymphocytes Relative: 14 %
Lymphs Abs: 1 10*3/uL (ref 0.7–4.0)
MCH: 29.6 pg (ref 26.0–34.0)
MCHC: 34.5 g/dL (ref 30.0–36.0)
MCV: 85.7 fL (ref 80.0–100.0)
Monocytes Absolute: 0.6 10*3/uL (ref 0.1–1.0)
Monocytes Relative: 8 %
Neutro Abs: 5.6 10*3/uL (ref 1.7–7.7)
Neutrophils Relative %: 77 %
Platelet Count: 212 10*3/uL (ref 150–400)
RBC: 4.63 MIL/uL (ref 4.22–5.81)
RDW: 13.1 % (ref 11.5–15.5)
WBC Count: 7.3 10*3/uL (ref 4.0–10.5)
nRBC: 0 % (ref 0.0–0.2)
nRBC: 0 /100{WBCs}

## 2023-04-19 LAB — CMP (CANCER CENTER ONLY)
ALT: 34 U/L (ref 0–44)
AST: 21 U/L (ref 15–41)
Albumin: 4.5 g/dL (ref 3.5–5.0)
Alkaline Phosphatase: 111 U/L (ref 38–126)
Anion gap: 11 (ref 5–15)
BUN: 23 mg/dL (ref 8–23)
CO2: 23 mmol/L (ref 22–32)
Calcium: 9.4 mg/dL (ref 8.9–10.3)
Chloride: 103 mmol/L (ref 98–111)
Creatinine: 1.11 mg/dL (ref 0.61–1.24)
GFR, Estimated: >60 mL/min (ref >60)
Glucose, Bld: 259 mg/dL — ABNORMAL HIGH (ref 70–99)
Potassium: 4.4 mmol/L (ref 3.5–5.1)
Sodium: 138 mmol/L (ref 135–145)
Total Bilirubin: 0.4 mg/dL (ref 0.0–1.2)
Total Protein: 6.7 g/dL (ref 6.5–8.1)

## 2023-04-20 LAB — AFP TUMOR MARKER: AFP, Serum, Tumor Marker: <1.8 ng/mL (ref 0.0–8.4)

## 2023-04-29 ENCOUNTER — Other Ambulatory Visit: Payer: Self-pay | Admitting: Diagnostic Radiology

## 2023-04-29 DIAGNOSIS — C220 Liver cell carcinoma: Secondary | ICD-10-CM

## 2023-09-17 ENCOUNTER — Telehealth: Payer: Self-pay | Admitting: Oncology

## 2023-09-17 NOTE — Telephone Encounter (Signed)
 09/17/23 Wife called and requested an appt for the end of Aug.Appt scheduled per request.

## 2023-09-30 ENCOUNTER — Other Ambulatory Visit: Payer: Self-pay | Admitting: Oncology

## 2023-09-30 DIAGNOSIS — C220 Liver cell carcinoma: Secondary | ICD-10-CM

## 2023-10-03 NOTE — Progress Notes (Unsigned)
 [facility] [address] [place],  [place]  [address] [phone]  Clinic Day:  10/04/2023  Referring physician: Carlon Mitzie SAUNDERS, FNP  HISTORY OF PRESENT ILLNESS:  The patient is a 73 y.o. male  with stage IA (T1a N0 M0) hepatocellular carcinoma.  As he only had one focus of disease, the patient underwent microwave ablation therapy in late February 2023.  Scans done since then have shown an adequate response to therapy, with no signs of disease progression.  He comes in today for routine follow-up.  Since his last visit, the patient has been doing okay.  However, he reports a significant decline in his energy level, which he corresponds to when both his ozempic and lisinopril-hydrochlorothiazide were started a few months ago.  He also complains of being very dizzy when going up steps or changing head positions.  He denies having any symptoms which concern him for possible recurrence of his hepatocellular carcinoma.  Of note, he is scheduled to undergo a repeat liver MRI on September 11th for his radiographic hepatocellular carcinoma surveillance.  PHYSICAL EXAM:  Blood pressure 133/67, pulse 93, temperature 98.1 F (36.7 C), temperature source Oral, resp. rate 16, height 5' 4 (1.626 m), weight 148 lb 6.4 oz (67.3 kg), SpO2 100%. Wt Readings from Last 3 Encounters:  10/04/23 148 lb 6.4 oz (67.3 kg)  04/19/23 156 lb 4.8 oz (70.9 kg)  06/05/21 156 lb 6.4 oz (70.9 kg)   Body mass index is 25.47 kg/m. Performance status (ECOG): 1 - Symptomatic but completely ambulatory Physical Exam Constitutional:      Appearance: Normal appearance. He is not ill-appearing.  HENT:     Mouth/Throat:     Mouth: Mucous membranes are moist.     Pharynx: Oropharynx is clear. No oropharyngeal exudate or posterior oropharyngeal erythema.  Cardiovascular:     Rate and Rhythm: Normal rate and regular rhythm.     Heart sounds: No murmur heard.    No friction rub. No gallop.   Pulmonary:     Effort: Pulmonary effort is normal. No respiratory distress.     Breath sounds: Normal breath sounds. No wheezing, rhonchi or rales.  Abdominal:     General: Bowel sounds are normal. There is no distension.     Palpations: Abdomen is soft. There is no mass.     Tenderness: There is no abdominal tenderness.  Musculoskeletal:        General: No swelling.     Right lower leg: No edema.     Left lower leg: No edema.  Lymphadenopathy:     Cervical: No cervical adenopathy.     Upper Body:     Right upper body: No supraclavicular or axillary adenopathy.     Left upper body: No supraclavicular or axillary adenopathy.     Lower Body: No right inguinal adenopathy. No left inguinal adenopathy.  Skin:    General: Skin is warm.     Coloration: Skin is not jaundiced.     Findings: No lesion or rash.  Neurological:     General: No focal deficit present.     Mental Status: He is alert and oriented to person, place, and time. Mental status is at baseline.  Psychiatric:        Mood and Affect: Mood normal.        Behavior: Behavior normal.        Thought Content: Thought content normal.    LABS:      Latest  Ref Rng & Units 10/04/2023    2:18 PM 04/19/2023    3:25 PM 06/05/2021   12:00 AM  CBC  WBC 4.0 - 10.5 K/uL 6.2  7.3  5.9      Hemoglobin 13.0 - 17.0 g/dL 86.7  86.2  85.7      Hematocrit 39.0 - 52.0 % 37.6  39.7  44      Platelets 150 - 400 K/uL 203  212  225         This result is from an external source.      Latest Ref Rng & Units 10/04/2023    2:18 PM 04/19/2023    3:25 PM 06/05/2021   12:00 AM  CMP  Glucose 70 - 99 mg/dL 735  740    BUN 8 - 23 mg/dL 35  23  15      Creatinine 0.61 - 1.24 mg/dL 8.53  8.88  0.8      Sodium 135 - 145 mmol/L 138  138  137      Potassium 3.5 - 5.1 mmol/L 3.6  4.4  4.0      Chloride 98 - 111 mmol/L 97  103  100      CO2 22 - 32 mmol/L 25  23  28       Calcium 8.9 - 10.3 mg/dL 9.7  9.4  9.6      Total Protein 6.5 - 8.1 g/dL 7.1  6.7     Total Bilirubin 0.0 - 1.2 mg/dL 0.5  0.4    Alkaline Phos 38 - 126 U/L 75  111  81      AST 15 - 41 U/L 13  21  20       ALT 0 - 44 U/L 20  34  26         This result is from an external source.    Latest Reference Range & Units 10/04/23 14:18  Iron 45 - 182 ug/dL 881  UIBC ug/dL 809  TIBC 749 - 549 ug/dL 691  Saturation Ratios 17.9 - 39.5 % 38  Ferritin 24 - 336 ng/mL 131  Folate >5.9 ng/mL 18.4  Vitamin B12 180 - 914 pg/mL <150 (L)  (L): Data is abnormally low  Latest Reference Range & Units 10/04/23 14:18  AFP, Serum, Tumor Marker 0.0 - 8.4 ng/mL <1.8   ASSESSMENT & PLAN:  A 73 y.o. male with stage IA (T1a N0 M0) hepatocellular carcinoma, who underwent microwave ablation therapy to his isolated liver lesion in late February 2023.  In clinic today, due to his dizziness, orthostatics were checked, which showed that his systolic blood pressure dropped by 33 points upon standing.  Based upon this, I will give him 1 L of fluids to make him more euvolemic.  I made sure the patient understood that his blood pressure medicine does have a diuretic in it.  When factoring that in with this being the hotter part of the year, it will be imperative for him to get ample fluid intake to prevent orthostatic hypotension/dehydration from remaining a prominent issue.  His level of fatigue can also be explained by his essentially undetectable B12 level.  I will arrange for him to receive a protracted course of B12 injections to ensure his cobalamin stores get refortified.  As mentioned previously, this patient is scheduled for a liver MRI in the forthcoming weeks.  His alpha-fetoprotein today is undetectable, which is reassuring that his disease remains under ideal control.  Nevertheless, I will see  him back in 2 weeks to go over his liver MRI images and their implications.  The patient understands all the plans discussed today and is in agreement with them.  Latarra Eagleton DELENA Kerns, MD

## 2023-10-04 ENCOUNTER — Inpatient Hospital Stay

## 2023-10-04 ENCOUNTER — Other Ambulatory Visit: Payer: Self-pay | Admitting: Oncology

## 2023-10-04 ENCOUNTER — Inpatient Hospital Stay: Attending: Oncology | Admitting: Oncology

## 2023-10-04 VITALS — BP 133/67 | HR 93 | Temp 98.1°F | Resp 16 | Ht 64.0 in | Wt 148.4 lb

## 2023-10-04 VITALS — BP 159/80 | HR 84

## 2023-10-04 DIAGNOSIS — C220 Liver cell carcinoma: Secondary | ICD-10-CM | POA: Insufficient documentation

## 2023-10-04 DIAGNOSIS — R42 Dizziness and giddiness: Secondary | ICD-10-CM | POA: Insufficient documentation

## 2023-10-04 DIAGNOSIS — Z79899 Other long term (current) drug therapy: Secondary | ICD-10-CM | POA: Diagnosis not present

## 2023-10-04 DIAGNOSIS — R5383 Other fatigue: Secondary | ICD-10-CM | POA: Insufficient documentation

## 2023-10-04 DIAGNOSIS — E860 Dehydration: Secondary | ICD-10-CM

## 2023-10-04 LAB — CBC WITH DIFFERENTIAL (CANCER CENTER ONLY)
Abs Immature Granulocytes: 0.01 K/uL (ref 0.00–0.07)
Basophils Absolute: 0 K/uL (ref 0.0–0.1)
Basophils Relative: 0 %
Eosinophils Absolute: 0.1 K/uL (ref 0.0–0.5)
Eosinophils Relative: 1 %
HCT: 37.6 % — ABNORMAL LOW (ref 39.0–52.0)
Hemoglobin: 13.2 g/dL (ref 13.0–17.0)
Immature Granulocytes: 0 %
Lymphocytes Relative: 18 %
Lymphs Abs: 1.1 K/uL (ref 0.7–4.0)
MCH: 29.6 pg (ref 26.0–34.0)
MCHC: 35.1 g/dL (ref 30.0–36.0)
MCV: 84.3 fL (ref 80.0–100.0)
Monocytes Absolute: 0.4 K/uL (ref 0.1–1.0)
Monocytes Relative: 7 %
Neutro Abs: 4.6 K/uL (ref 1.7–7.7)
Neutrophils Relative %: 74 %
Platelet Count: 203 K/uL (ref 150–400)
RBC: 4.46 MIL/uL (ref 4.22–5.81)
RDW: 12.3 % (ref 11.5–15.5)
WBC Count: 6.2 K/uL (ref 4.0–10.5)
nRBC: 0 % (ref 0.0–0.2)

## 2023-10-04 LAB — CMP (CANCER CENTER ONLY)
ALT: 20 U/L (ref 0–44)
AST: 13 U/L — ABNORMAL LOW (ref 15–41)
Albumin: 4.4 g/dL (ref 3.5–5.0)
Alkaline Phosphatase: 75 U/L (ref 38–126)
Anion gap: 17 — ABNORMAL HIGH (ref 5–15)
BUN: 35 mg/dL — ABNORMAL HIGH (ref 8–23)
CO2: 25 mmol/L (ref 22–32)
Calcium: 9.7 mg/dL (ref 8.9–10.3)
Chloride: 97 mmol/L — ABNORMAL LOW (ref 98–111)
Creatinine: 1.46 mg/dL — ABNORMAL HIGH (ref 0.61–1.24)
GFR, Estimated: 50 mL/min — ABNORMAL LOW (ref >60)
Glucose, Bld: 264 mg/dL — ABNORMAL HIGH (ref 70–99)
Potassium: 3.6 mmol/L (ref 3.5–5.1)
Sodium: 138 mmol/L (ref 135–145)
Total Bilirubin: 0.5 mg/dL (ref 0.0–1.2)
Total Protein: 7.1 g/dL (ref 6.5–8.1)

## 2023-10-04 LAB — IRON AND TIBC
Iron: 118 ug/dL (ref 45–182)
Saturation Ratios: 38 % (ref 17.9–39.5)
TIBC: 308 ug/dL (ref 250–450)
UIBC: 190 ug/dL

## 2023-10-04 LAB — FERRITIN: Ferritin: 131 ng/mL (ref 24–336)

## 2023-10-04 LAB — VITAMIN B12: Vitamin B-12: <150 pg/mL — ABNORMAL LOW (ref 180–914)

## 2023-10-04 MED ORDER — SODIUM CHLORIDE 0.9 % IV SOLN: INTRAVENOUS | Status: DC

## 2023-10-04 NOTE — Patient Instructions (Signed)
 DO GATORADE / PROPEL / ETC. Dehydration, Adult Dehydration is a condition in which there is not enough water or other fluids in the body. This happens when a person loses more fluids than they take in. Important organs cannot work right without the right amount of fluids. Any loss of fluids from the body can cause dehydration. Dehydration can be mild, worse, or very bad. It should be treated right away to keep it from getting very bad. What are the causes? Conditions that cause loss of water in the body. They include: Watery poop (diarrhea). Vomiting. Sweating a lot. Fever. Infection. Peeing (urinating) a lot. Not drinking enough fluids. Certain medicines, such as medicines that take extra fluid out of the body (diuretics). Lack of safe drinking water. Not being able to get enough water and food. What increases the risk? Having a long-term (chronic) illness that has not been treated the right way, such as: Diabetes. Heart disease. Kidney disease. Being 53 years of age or older. Having a disability. Living in a place that is high above the ground or sea (high in altitude). The thinner, drier air causes more fluid loss. Doing exercises that put stress on your body for a long time. Being active when in hot places. What are the signs or symptoms? Symptoms of dehydration depend on how bad it is. Mild or worse dehydration Thirst. Dry lips or dry mouth. Feeling dizzy or light-headed. Muscle cramps. Passing little pee or dark pee. Pee may be the color of tea. Headache. Very bad dehydration Changes in skin. Skin may: Be cold to the touch (clammy). Be blotchy or pale. Not go back to normal right after you pinch it and let it go. Little or no tears, pee, or sweat. Fast breathing. Low blood pressure. Weak pulse. Pulse that is more than 100 beats a minute when you are sitting still. Other changes, such as: Feeling very thirsty. Eyes that look hollow (sunken). Cold hands and  feet. Being confused. Being very tired (lethargic) or having trouble waking from sleep. Losing weight. Loss of consciousness. How is this treated? Treatment for this condition depends on how bad your dehydration is. Treatment should start right away. Do not wait until your condition gets very bad. Very bad dehydration is an emergency. You will need to go to a hospital. Mild or worse dehydration can be treated at home. You may be asked to: Drink more fluids. Drink an oral rehydration solution (ORS). This drink gives you the right amount of fluids, salts, and minerals (electrolytes). Very bad dehydration can be treated: With fluids through an IV tube. By correcting low levels of electrolytes in the body. By treating the problem that caused your dehydration. Follow these instructions at home: Oral rehydration solution If told by your doctor, drink an ORS: Make an ORS. Use instructions on the package. Start by drinking small amounts, about  cup (120 mL) every 5-10 minutes. Slowly drink more until you have had the amount that your doctor said to have.  Eating and drinking  Drink enough clear fluid to keep your pee pale yellow. If you were told to drink an ORS, finish the ORS first. Then, start slowly drinking other clear fluids. Drink fluids such as: Water. Do not drink only water. Doing that can make the salt (sodium) level in your body get too low. Water from ice chips you suck on. Fruit juice that you have added water to (diluted). Low-calorie sports drinks. Eat foods that have the right amounts of salts and  minerals, such as bananas, oranges, potatoes, tomatoes, or spinach. Do not drink alcohol. Avoid drinks that have caffeine or sugar. These include:: High-calorie sports drinks. Fruit juice that you did not add water to. Soda. Coffee or energy drinks. Avoid foods that are greasy or have a lot of fat or sugar. General instructions Take over-the-counter and prescription medicines  only as told by your doctor. Do not take sodium tablets. Doing that can make the salt level in your body get too high. Return to your normal activities as told by your doctor. Ask your doctor what activities are safe for you. Keep all follow-up visits. Your doctor may check and change your treatment. Contact a doctor if: You have pain in your belly (abdomen) and the pain: Gets worse. Stays in one place. You have a rash. You have a stiff neck. You get angry or annoyed more easily than normal. You are more tired or have a harder time waking than normal. You feel weak or dizzy. You feel very thirsty. Get help right away if: You have any symptoms of very bad dehydration. You vomit every time you eat or drink. Your vomiting gets worse, does not go away, or you vomit blood or green stuff. You are getting treatment, but symptoms are getting worse. You have a fever. You have a very bad headache. You have: Diarrhea that gets worse or does not go away. Blood in your poop (stool). This may cause poop to look black and tarry. No pee in 6-8 hours. Only a small amount of pee in 6-8 hours, and the pee is very dark. You have trouble breathing. These symptoms may be an emergency. Get help right away. Call 911. Do not wait to see if the symptoms will go away. Do not drive yourself to the hospital. This information is not intended to replace advice given to you by your health care provider. Make sure you discuss any questions you have with your health care provider. Document Revised: 08/21/2021 Document Reviewed: 08/21/2021 Elsevier Patient Education  2024 [employer].

## 2023-10-05 LAB — AFP TUMOR MARKER: AFP, Serum, Tumor Marker: <1.8 ng/mL (ref 0.0–8.4)

## 2023-10-05 LAB — FOLATE: Folate: 18.4 ng/mL (ref >5.9)

## 2023-10-08 ENCOUNTER — Other Ambulatory Visit: Payer: Self-pay

## 2023-10-08 DIAGNOSIS — I10 Essential (primary) hypertension: Secondary | ICD-10-CM | POA: Insufficient documentation

## 2023-10-08 DIAGNOSIS — R011 Cardiac murmur, unspecified: Secondary | ICD-10-CM | POA: Insufficient documentation

## 2023-10-08 DIAGNOSIS — E7800 Pure hypercholesterolemia, unspecified: Secondary | ICD-10-CM | POA: Insufficient documentation

## 2023-10-08 DIAGNOSIS — E119 Type 2 diabetes mellitus without complications: Secondary | ICD-10-CM | POA: Insufficient documentation

## 2023-10-08 DIAGNOSIS — R110 Nausea: Secondary | ICD-10-CM | POA: Insufficient documentation

## 2023-10-08 DIAGNOSIS — R109 Unspecified abdominal pain: Secondary | ICD-10-CM | POA: Insufficient documentation

## 2023-10-08 DIAGNOSIS — N200 Calculus of kidney: Secondary | ICD-10-CM | POA: Insufficient documentation

## 2023-10-08 DIAGNOSIS — Z87442 Personal history of urinary calculi: Secondary | ICD-10-CM | POA: Insufficient documentation

## 2023-10-08 DIAGNOSIS — R630 Anorexia: Secondary | ICD-10-CM | POA: Insufficient documentation

## 2023-10-09 ENCOUNTER — Ambulatory Visit: Admitting: Cardiology

## 2023-10-17 ENCOUNTER — Ambulatory Visit (HOSPITAL_BASED_OUTPATIENT_CLINIC_OR_DEPARTMENT_OTHER)
Admission: RE | Admit: 2023-10-17 | Discharge: 2023-10-17 | Disposition: A | Discharge status: Home / Self Care | Source: Ambulatory Visit | Attending: Oncology | Admitting: Oncology

## 2023-10-17 DIAGNOSIS — C220 Liver cell carcinoma: Secondary | ICD-10-CM

## 2023-10-17 MED ORDER — GADOBUTROL 1 MMOL/ML IV SOLN
6.7000 mL | Freq: Once | INTRAVENOUS | Status: AC | PRN
  Administered 2023-10-17: 6.7 mL via INTRAVENOUS

## 2023-10-17 NOTE — Progress Notes (Signed)
 [facility] at [facility] [address] [place],  [place]  [address] [phone]  Clinic Day: 10/18/2023  Referring physician: Carlon Mitzie SAUNDERS, FNP  HISTORY OF PRESENT ILLNESS:  The patient is a 73 y.o. male  with stage IA (T1a N0 M0) hepatocellular carcinoma.  As he only had one focus of disease, the patient underwent microwave ablation therapy in late February 2023.  Scans done since then have shown an adequate response to therapy, with no signs of disease progression.  He comes in today to review his abdominal MRI images from yesterday.  Since his last visit, the patient has been doing okay.  He denies having any symptoms/findings which concern him for possible recurrence of his hepatocellular carcinoma.    PHYSICAL EXAM:  Blood pressure 138/68, pulse 91, temperature 97.9 F (36.6 C), temperature source Oral, resp. rate 18, height 5' 4 (1.626 m), weight 149 lb 11.2 oz (67.9 kg), SpO2 97%. Wt Readings from Last 3 Encounters:  10/18/23 149 lb 11.2 oz (67.9 kg)  10/04/23 148 lb 6.4 oz (67.3 kg)  04/19/23 156 lb 4.8 oz (70.9 kg)   Body mass index is 25.7 kg/m. Performance status (ECOG): 1 - Symptomatic but completely ambulatory Physical Exam Constitutional:      Appearance: Normal appearance. He is not ill-appearing.  HENT:     Mouth/Throat:     Mouth: Mucous membranes are moist.     Pharynx: Oropharynx is clear. No oropharyngeal exudate or posterior oropharyngeal erythema.  Cardiovascular:     Rate and Rhythm: Normal rate and regular rhythm.     Heart sounds: No murmur heard.    No friction rub. No gallop.  Pulmonary:     Effort: Pulmonary effort is normal. No respiratory distress.     Breath sounds: Normal breath sounds. No wheezing, rhonchi or rales.  Abdominal:     General: Bowel sounds are normal. There is no distension.     Palpations: Abdomen is soft. There is no mass.     Tenderness: There is no abdominal tenderness.  Musculoskeletal:         General: No swelling.     Right lower leg: No edema.     Left lower leg: No edema.  Lymphadenopathy:     Cervical: No cervical adenopathy.     Upper Body:     Right upper body: No supraclavicular or axillary adenopathy.     Left upper body: No supraclavicular or axillary adenopathy.     Lower Body: No right inguinal adenopathy. No left inguinal adenopathy.  Skin:    General: Skin is warm.     Coloration: Skin is not jaundiced.     Findings: No lesion or rash.  Neurological:     General: No focal deficit present.     Mental Status: He is alert and oriented to person, place, and time. Mental status is at baseline.  Psychiatric:        Mood and Affect: Mood normal.        Behavior: Behavior normal.        Thought Content: Thought content normal.   SCANS:  Her liver MRI on 10-17-23 revealed the following: FINDINGS:   LOWER CHEST: Mild cardiomegaly. Volume loss at the anterior right lung base is unchanged.   LIVER: Marked hepatic steatosis. Mild cirrhosis, as evidenced by caudate lobe enlargement and subtle irregular hepatic capsule. High right hepatic lobe focus of susceptibility artifact including at 7 mm on 8/30 is unchanged. The ablation defect is  again identified within segment 8. It measures 2.7 x 2.0 x 2.2 cm on image 26/23 and image 32/27. Compare 2.8 x 2.3 x 2.5 cm on the prior exam when we measured in a similar fashion. No central or nodular enhancement identified to suggest local recurrence. Linear peripheral enhancement is likely treatment-related hyperemia and not significantly changed. Scattered foci of right hepatic lobe arterial phase hyper-enhancement at less than a centimeter including on 48/21 likely represent perfusion anomalies.   GALLBLADDER AND BILE DUCTS: No cholelithiasis. No wall thickening. No biliary ductal dilatation.   PANCREAS: Normal T1 hyperintense signal. No mass. No peripancreatic fluid.   SPLEEN: Normal size. No focal lesion.   ADRENAL  GLANDS: Normal appearance. No mass.   KIDNEYS AND URETERS: Lower pole left renal 7 mm non-enhancing lesion is borderline too small to characterize but likely a cyst. No specific follow up indicated. No hydronephrosis.   VISUALIZED GI TRACT: Normal course and caliber.   LYMPH NODES: No lymphadenopathy.   VASULATURE: Patent portal and splenic veins. No portal venous hypertension.   PERITONEUM: No ascites.   ABDOMINAL WALL: No hernia. No mass.   BONES: Hemangioma within the T11 vertebral body.   IMPRESSION: 1. Mild decrease in size of ablation defect within segment 8, without evidence of local recurrence or metastatic disease. 2.  Marked hepatic steatosis.  LABS:      Latest Ref Rng & Units 10/04/2023    2:18 PM 04/19/2023    3:25 PM 06/05/2021   12:00 AM  CBC  WBC 4.0 - 10.5 K/uL 6.2  7.3  5.9      Hemoglobin 13.0 - 17.0 g/dL 86.7  86.2  85.7      Hematocrit 39.0 - 52.0 % 37.6  39.7  44      Platelets 150 - 400 K/uL 203  212  225         This result is from an external source.      Latest Ref Rng & Units 10/04/2023    2:18 PM 04/19/2023    3:25 PM 06/05/2021   12:00 AM  CMP  Glucose 70 - 99 mg/dL 735  740    BUN 8 - 23 mg/dL 35  23  15      Creatinine 0.61 - 1.24 mg/dL 8.53  8.88  0.8      Sodium 135 - 145 mmol/L 138  138  137      Potassium 3.5 - 5.1 mmol/L 3.6  4.4  4.0      Chloride 98 - 111 mmol/L 97  103  100      CO2 22 - 32 mmol/L 25  23  28       Calcium 8.9 - 10.3 mg/dL 9.7  9.4  9.6      Total Protein 6.5 - 8.1 g/dL 7.1  6.7    Total Bilirubin 0.0 - 1.2 mg/dL 0.5  0.4    Alkaline Phos 38 - 126 U/L 75  111  81      AST 15 - 41 U/L 13  21  20       ALT 0 - 44 U/L 20  34  26         This result is from an external source.    Latest Reference Range & Units 10/04/23 14:18  Iron 45 - 182 ug/dL 881  UIBC ug/dL 809  TIBC 749 - 549 ug/dL 691  Saturation Ratios 17.9 - 39.5 % 38  Ferritin 24 - 336 ng/mL 131  Folate >5.9 ng/mL 18.4  Vitamin B12 180 - 914  pg/mL <150 (L)  (L): Data is abnormally low  Latest Reference Range & Units 10/04/23 14:18  AFP, Serum, Tumor Marker 0.0 - 8.4 ng/mL <1.8   ASSESSMENT & PLAN:  A 73 y.o. male with stage IA (T1a N0 M0) hepatocellular carcinoma, who underwent microwave ablation therapy to his isolated liver lesion in late February 2023.  In clinic today, I went over his abdominal MRI images with him, for which he could see that his treated lesion continues to show no signs of local recurrence.  Furthermore, no other ominous liver lesions were seen.  I am also pleased as a recent alpha-fetoprotein level came back undetectable.  From a hepatocellular carcinoma standpoint, the patient remains disease-free.  A repeat abdominal MRI will be done in 6 months to ensure there remains no radiographic evidence of disease surveillance.  On another note, as he was recently found to be severely B12 deficient, he knows to continue taking a protracted course of B12 injections to ensure adequate fortification of his cobalamin stores.  Otherwise, I will see this patient back in 6 months for repeat clinical assessment.  The patient understands all the plans discussed today and is in agreement with them.  Jacob Cicero DELENA Kerns, MD

## 2023-10-18 ENCOUNTER — Other Ambulatory Visit: Payer: Self-pay | Admitting: Oncology

## 2023-10-18 ENCOUNTER — Inpatient Hospital Stay: Attending: Oncology | Admitting: Oncology

## 2023-10-18 ENCOUNTER — Inpatient Hospital Stay

## 2023-10-18 VITALS — BP 138/68 | HR 91 | Temp 97.9°F | Resp 18 | Ht 64.0 in | Wt 149.7 lb

## 2023-10-18 DIAGNOSIS — E860 Dehydration: Secondary | ICD-10-CM | POA: Diagnosis not present

## 2023-10-18 DIAGNOSIS — T465X5A Adverse effect of other antihypertensive drugs, initial encounter: Secondary | ICD-10-CM | POA: Diagnosis not present

## 2023-10-18 DIAGNOSIS — I952 Hypotension due to drugs: Secondary | ICD-10-CM | POA: Diagnosis not present

## 2023-10-18 DIAGNOSIS — Z79899 Other long term (current) drug therapy: Secondary | ICD-10-CM | POA: Insufficient documentation

## 2023-10-18 DIAGNOSIS — C220 Liver cell carcinoma: Secondary | ICD-10-CM | POA: Diagnosis present

## 2023-10-18 DIAGNOSIS — I10 Essential (primary) hypertension: Secondary | ICD-10-CM | POA: Diagnosis not present

## 2023-10-18 MED ORDER — SODIUM CHLORIDE 0.9 % IV SOLN: INTRAVENOUS | Status: AC

## 2023-10-18 NOTE — Patient Instructions (Signed)
 Dehydration, Adult Dehydration is a condition in which there is not enough water or other fluids in the body. This happens when a person loses more fluids than they take in. Important organs cannot work right without the right amount of fluids. Any loss of fluids from the body can cause dehydration. Dehydration can be mild, worse, or very bad. It should be treated right away to keep it from getting very bad. What are the causes? Conditions that cause loss of water in the body. They include: Watery poop (diarrhea). Vomiting. Sweating a lot. Fever. Infection. Peeing (urinating) a lot. Not drinking enough fluids. Certain medicines, such as medicines that take extra fluid out of the body (diuretics). Lack of safe drinking water. Not being able to get enough water and food. What increases the risk? Having a long-term (chronic) illness that has not been treated the right way, such as: Diabetes. Heart disease. Kidney disease. Being 25 years of age or older. Having a disability. Living in a place that is high above the ground or sea (high in altitude). The thinner, drier air causes more fluid loss. Doing exercises that put stress on your body for a long time. Being active when in hot places. What are the signs or symptoms? Symptoms of dehydration depend on how bad it is. Mild or worse dehydration Thirst. Dry lips or dry mouth. Feeling dizzy or light-headed. Muscle cramps. Passing little pee or dark pee. Pee may be the color of tea. Headache. Very bad dehydration Changes in skin. Skin may: Be cold to the touch (clammy). Be blotchy or pale. Not go back to normal right after you pinch it and let it go. Little or no tears, pee, or sweat. Fast breathing. Low blood pressure. Weak pulse. Pulse that is more than 100 beats a minute when you are sitting still. Other changes, such as: Feeling very thirsty. Eyes that look hollow (sunken). Cold hands and feet. Being confused. Being very  tired (lethargic) or having trouble waking from sleep. Losing weight. Loss of consciousness. How is this treated? Treatment for this condition depends on how bad your dehydration is. Treatment should start right away. Do not wait until your condition gets very bad. Very bad dehydration is an emergency. You will need to go to a hospital. Mild or worse dehydration can be treated at home. You may be asked to: Drink more fluids. Drink an oral rehydration solution (ORS). This drink gives you the right amount of fluids, salts, and minerals (electrolytes). Very bad dehydration can be treated: With fluids through an IV tube. By correcting low levels of electrolytes in the body. By treating the problem that caused your dehydration. Follow these instructions at home: Oral rehydration solution If told by your doctor, drink an ORS: Make an ORS. Use instructions on the package. Start by drinking small amounts, about  cup (120 mL) every 5-10 minutes. Slowly drink more until you have had the amount that your doctor said to have.  Eating and drinking  Drink enough clear fluid to keep your pee pale yellow. If you were told to drink an ORS, finish the ORS first. Then, start slowly drinking other clear fluids. Drink fluids such as: Water. Do not drink only water. Doing that can make the salt (sodium) level in your body get too low. Water from ice chips you suck on. Fruit juice that you have added water to (diluted). Low-calorie sports drinks. Eat foods that have the right amounts of salts and minerals, such as bananas, oranges, potatoes,  tomatoes, or spinach. Do not drink alcohol. Avoid drinks that have caffeine or sugar. These include:: High-calorie sports drinks. Fruit juice that you did not add water to. Soda. Coffee or energy drinks. Avoid foods that are greasy or have a lot of fat or sugar. General instructions Take over-the-counter and prescription medicines only as told by your doctor. Do  not take sodium tablets. Doing that can make the salt level in your body get too high. Return to your normal activities as told by your doctor. Ask your doctor what activities are safe for you. Keep all follow-up visits. Your doctor may check and change your treatment. Contact a doctor if: You have pain in your belly (abdomen) and the pain: Gets worse. Stays in one place. You have a rash. You have a stiff neck. You get angry or annoyed more easily than normal. You are more tired or have a harder time waking than normal. You feel weak or dizzy. You feel very thirsty. Get help right away if: You have any symptoms of very bad dehydration. You vomit every time you eat or drink. Your vomiting gets worse, does not go away, or you vomit blood or green stuff. You are getting treatment, but symptoms are getting worse. You have a fever. You have a very bad headache. You have: Diarrhea that gets worse or does not go away. Blood in your poop (stool). This may cause poop to look black and tarry. No pee in 6-8 hours. Only a small amount of pee in 6-8 hours, and the pee is very dark. You have trouble breathing. These symptoms may be an emergency. Get help right away. Call 911. Do not wait to see if the symptoms will go away. Do not drive yourself to the hospital. This information is not intended to replace advice given to you by your health care provider. Make sure you discuss any questions you have with your health care provider. Document Revised: 08/21/2021 Document Reviewed: 08/21/2021 Elsevier Patient Education  2024 [employer].

## 2023-10-18 NOTE — Progress Notes (Signed)
 Per Dr. Ezzard- May increase fluids to give 1 liter over 1 hour.

## 2023-10-30 ENCOUNTER — Other Ambulatory Visit: Payer: Self-pay | Admitting: Oncology

## 2023-10-30 DIAGNOSIS — E538 Deficiency of other specified B group vitamins: Secondary | ICD-10-CM

## 2023-10-30 DIAGNOSIS — D649 Anemia, unspecified: Secondary | ICD-10-CM

## 2023-12-18 ENCOUNTER — Ambulatory Visit: Payer: Self-pay

## 2023-12-18 NOTE — Telephone Encounter (Signed)
 Copied from CRM [id number]. Topic: Clinical - Red Word Triage >> Dec 18, 2023  3:10 PM Ronald Flores wrote: [facility] that prompted transfer to Nurse Triage: dizziness and dehydration. Needs to establish care. Reason for Disposition  Taking a medicine that could cause dizziness (e.g., blood pressure medications, diuretics)  Answer Assessment - Initial Assessment Questions Advised UC today and ED if symptoms worsen.  Patient's wife declines UC.  New pt appt scheduled 12/27/23.  1. DESCRIPTION: Describe your dizziness.     Dizziness comes and goes 2. LIGHTHEADED: Do you feel lightheaded? (e.g., somewhat faint, woozy, weak upon standing)     denies 3. VERTIGO: Do you feel like either you or the room is spinning or tilting? (i.e., vertigo)     Just dizzy 4. SEVERITY: How bad is it?  Do you feel like you are going to faint? Can you stand and walk?     When walking up or down stairs 5. ONSET:  When did the dizziness begin?     3 months 6. AGGRAVATING FACTORS: Does anything make it worse? (e.g., standing, change in head position)     Walking stairs 7. HEART RATE: Can you tell me your heart rate? How many beats in 15 seconds?  (Note: Not all patients can do this.)       BP; lisinopril, last taken this morning, did not check today and not at home 8. CAUSE: What do you think is causing the dizziness? (e.g., decreased fluids or food, diarrhea, emotional distress, heat exposure, new medicine, sudden standing, vomiting; unknown)     Unsure, thought it was ozempic but took self off 9. RECURRENT SYMPTOM: Have you had dizziness before? If Yes, ask: When was the last time? What happened that time?     Last episode; today, every time I walk up and down stairs Reports drinking enough fluids; gatorade and water 10. OTHER SYMPTOMS: Do you have any other symptoms? (e.g., fever, chest pain, vomiting, diarrhea, bleeding)       Denies dizziness, blurred vision, HA, sob, chest pain,  fever, n/v  Protocols used: Dizziness - Lightheadedness-A-AH

## 2023-12-27 ENCOUNTER — Ambulatory Visit (INDEPENDENT_AMBULATORY_CARE_PROVIDER_SITE_OTHER): Admitting: Family Medicine

## 2023-12-27 ENCOUNTER — Encounter (HOSPITAL_BASED_OUTPATIENT_CLINIC_OR_DEPARTMENT_OTHER): Payer: Self-pay

## 2023-12-27 ENCOUNTER — Encounter (HOSPITAL_BASED_OUTPATIENT_CLINIC_OR_DEPARTMENT_OTHER): Payer: Self-pay | Admitting: Family Medicine

## 2023-12-27 VITALS — BP 154/79 | HR 81 | Temp 97.9°F | Resp 16 | Ht 65.35 in | Wt 150.5 lb

## 2023-12-27 DIAGNOSIS — I10 Essential (primary) hypertension: Secondary | ICD-10-CM | POA: Diagnosis not present

## 2023-12-27 DIAGNOSIS — D510 Vitamin B12 deficiency anemia due to intrinsic factor deficiency: Secondary | ICD-10-CM | POA: Diagnosis not present

## 2023-12-27 DIAGNOSIS — R42 Dizziness and giddiness: Secondary | ICD-10-CM | POA: Insufficient documentation

## 2023-12-27 DIAGNOSIS — R3589 Other polyuria: Secondary | ICD-10-CM | POA: Diagnosis not present

## 2023-12-27 DIAGNOSIS — N400 Enlarged prostate without lower urinary tract symptoms: Secondary | ICD-10-CM | POA: Insufficient documentation

## 2023-12-27 MED ORDER — CYANOCOBALAMIN 1000 MCG/ML IJ SOLN
1000.0000 ug | Freq: Once | INTRAMUSCULAR | Status: AC
  Administered 2023-12-27: 1000 ug via INTRAMUSCULAR

## 2023-12-27 MED ORDER — LISINOPRIL 20 MG PO TABS: 20.0000 mg | ORAL_TABLET | Freq: Every day | ORAL | 2 refills | Status: DC

## 2023-12-27 MED ORDER — AMLODIPINE BESYLATE 5 MG PO TABS: 5.0000 mg | ORAL_TABLET | Freq: Every day | ORAL | 2 refills | Status: DC

## 2023-12-27 NOTE — Assessment & Plan Note (Addendum)
 Uncontrolled.  Start checking standing home BP regularly.  Amlodipine  added, and we can increase his Lisinopril  dosage in time.

## 2023-12-27 NOTE — Assessment & Plan Note (Addendum)
 Hold his thiazide and be careful with evening fluid intake.  Will see how he does over the weekend and may need him to drop by early next week for repeat labs.

## 2023-12-27 NOTE — Progress Notes (Signed)
 Patient is in office today for a nurse visit for B12 Injection. Patient Injection was given in the  Right deltoid. Patient tolerated injection well.

## 2023-12-27 NOTE — Progress Notes (Signed)
 New Patient Office Visit  Subjective    Patient ID: Ronald Flores, male    DOB: [birth year]  Age: 73 y.o. MRN: [id number]  CC:  Chief Complaint  Patient presents with   Establish Care    Establish care     HPI Ronald Flores presents to establish care F/u as above.  New to my practice.  He reports about 3-4 months of intermittent dizziness.  Has been swimmy headed and denies any vertigo issues.  Has already seen ENT.  It is postural and is exacerbated when he stands.  He states his BP is usually elevated.  He is already limiting his salt.  Upcoming plans for endoscopy.  He has been on Zestoretic for some time and is frustrated by his urinary frequency.  Has to get up 3-4 times nightly to urinate.  Tries to watch his evening fluid intake.  He decided to stop his Ozempic and isn't sure about his diabetic control.  Somewhat recent labs reviewed and his B12 was quite low.  Outpatient Encounter Medications as of 12/27/2023  Medication Sig   amLODipine  (NORVASC ) 5 MG tablet Take 1 tablet (5 mg total) by mouth daily.   lisinopril  (ZESTRIL ) 20 MG tablet Take 1 tablet (20 mg total) by mouth daily.   metFORMIN (GLUCOPHAGE) 1000 MG tablet Take 1,000 mg by mouth 2 (two) times daily with a meal.   tamsulosin (FLOMAX) 0.4 MG CAPS capsule Take 0.4 mg by mouth daily.   [DISCONTINUED] Exenatide ER 2 MG PEN Inject 2 mg into the skin every 7 (seven) days.   [DISCONTINUED] hydrALAZINE (APRESOLINE) 25 MG tablet Take 25 mg by mouth 2 (two) times daily.   [DISCONTINUED] HYDROcodone -acetaminophen  (NORCO) 5-325 MG tablet Take 1 tablet by mouth every 4 (four) hours as needed for up to 5 doses for severe pain.   [DISCONTINUED] lisinopril -hydrochlorothiazide (ZESTORETIC) 20-12.5 MG tablet Take 1 tablet by mouth 2 (two) times daily.   lisinopril  (ZESTRIL ) 20 MG tablet Take 40 mg by mouth in the morning. (Patient not taking: Reported on 12/27/2023)   [EXPIRED] cyanocobalamin  (VITAMIN B12) injection 1,000 mcg    No  facility-administered encounter medications on file as of 12/27/2023.    Past Medical History:  Diagnosis Date   Hepatocellular carcinoma (HCC) 03/05/2021   f/by [facility]   Hypercholesteremia    Hypertension    Nephrolithiasis 07/21/2013   Osteoarthritis    Prostatic hypertrophy    Type 2 diabetes mellitus [facility])     Past Surgical History:  Procedure Laterality Date   CATARACT EXTRACTION  05/15/2016   COLONOSCOPY N/A    planned 2025   CYSTOSCOPY W/ URETEROSCOPY W/ LITHOTRIPSY     IR RADIOLOGIST EVAL & MGMT  05/09/2021   IR RADIOLOGIST EVAL & MGMT  08/21/2021   RADIOFREQUENCY ABLATION N/A 04/12/2021   Procedure: MICROWAVE ABLATION;  Surgeon: Philip Cornet, MD;  Location: [facility];  Service: Anesthesiology;  Laterality: N/A;   WISDOM TOOTH EXTRACTION      Family History  Problem Relation Age of Onset   Breast cancer Mother    Liver cancer Brother    Colon cancer Neg Hx    Esophageal cancer Neg Hx     Social History   Tobacco Use   Smoking status: Never   Smokeless tobacco: Never  Vaping Use   Vaping status: Never Used  Substance Use Topics   Alcohol use: Never   Drug use: Never    Review of Systems  Constitutional:  Negative for diaphoresis, fever, malaise/fatigue  and weight loss.  Respiratory:  Negative for cough, shortness of breath and wheezing.   Cardiovascular:  Negative for chest pain, palpitations, orthopnea, claudication, leg swelling and PND.  Neurological:  Positive for dizziness. Negative for focal weakness and headaches.        Objective    BP (!) 154/79 (BP Location: Right Arm, Patient Position: Standing)   Pulse 81   Temp 97.9 F (36.6 C) (Oral)   Resp 16   Ht 5' 5.35 (1.66 m)   Wt 150 lb 8 oz (68.3 kg)   SpO2 98%   BMI 24.77 kg/m   Physical Exam Constitutional:      General: He is not in acute distress.    Appearance: Normal appearance.  HENT:     Head: Normocephalic.  Neck:     Vascular: No carotid bruit.  Cardiovascular:      Rate and Rhythm: Normal rate and regular rhythm.     Pulses: Normal pulses.     Heart sounds: Normal heart sounds.  Pulmonary:     Effort: Pulmonary effort is normal.     Breath sounds: Normal breath sounds.  Abdominal:     General: Bowel sounds are normal.     Palpations: Abdomen is soft.  Musculoskeletal:     Cervical back: Neck supple. No tenderness.     Right lower leg: No edema.     Left lower leg: No edema.  Neurological:     Mental Status: He is alert.         Assessment & Plan:  Dizziness Assessment & Plan: Likely due to chronically uncontrolled HTN.  Meds adjusted today.  Start checking standing BP at home daily and update me in a few days.   Pernicious anemia -     Cyanocobalamin   Polyuria Assessment & Plan: Hold his thiazide and be careful with evening fluid intake.  Will see how he does over the weekend and may need him to drop by early next week for repeat labs.   Primary hypertension Assessment & Plan: Uncontrolled.  Start checking standing home BP regularly.  Amlodipine  added, and we can increase his Lisinopril  dosage in time.  Orders: -     amLODIPine  Besylate; Take 1 tablet (5 mg total) by mouth daily.  Dispense: 30 tablet; Refill: 2 -     Lisinopril ; Take 1 tablet (20 mg total) by mouth daily.  Dispense: 30 tablet; Refill: 2  Prostatic hypertrophy Assessment & Plan: Continue Tamsulosin which should help his HTN control as well.     Return in about 2 weeks (around 01/10/2024) for chronic follow-up.   REDDING PONCE NORLEEN FALCON., MD

## 2023-12-27 NOTE — Assessment & Plan Note (Signed)
 Likely due to chronically uncontrolled HTN.  Meds adjusted today.  Start checking standing BP at home daily and update me in a few days.

## 2023-12-27 NOTE — Assessment & Plan Note (Signed)
 Continue Tamsulosin which should help his HTN control as well.

## 2024-01-01 ENCOUNTER — Telehealth (HOSPITAL_BASED_OUTPATIENT_CLINIC_OR_DEPARTMENT_OTHER): Payer: Self-pay

## 2024-01-01 NOTE — Telephone Encounter (Signed)
 Patient came by and wanted Dr Dottie to know that the change in his medication seemed to be working and the dizziness has improved .

## 2024-01-10 ENCOUNTER — Encounter (HOSPITAL_BASED_OUTPATIENT_CLINIC_OR_DEPARTMENT_OTHER): Payer: Self-pay | Admitting: Family Medicine

## 2024-01-10 ENCOUNTER — Ambulatory Visit (INDEPENDENT_AMBULATORY_CARE_PROVIDER_SITE_OTHER): Admitting: Family Medicine

## 2024-01-10 VITALS — BP 175/79 | HR 79 | Temp 97.5°F | Resp 16 | Wt 152.8 lb

## 2024-01-10 DIAGNOSIS — I10 Essential (primary) hypertension: Secondary | ICD-10-CM | POA: Diagnosis not present

## 2024-01-10 DIAGNOSIS — M79672 Pain in left foot: Secondary | ICD-10-CM | POA: Diagnosis not present

## 2024-01-10 DIAGNOSIS — N400 Enlarged prostate without lower urinary tract symptoms: Secondary | ICD-10-CM | POA: Diagnosis not present

## 2024-01-10 DIAGNOSIS — Z23 Encounter for immunization: Secondary | ICD-10-CM

## 2024-01-10 MED ORDER — LISINOPRIL 10 MG PO TABS: 10.0000 mg | ORAL_TABLET | Freq: Every day | ORAL | 3 refills | Status: DC

## 2024-01-10 NOTE — Progress Notes (Signed)
 Established Patient Office Visit  Subjective   Patient ID: Ronald Flores, male    DOB: 1950/12/26  Age: 73 y.o. MRN: [id number]  Chief Complaint  Patient presents with   Follow-up    Follow-up    F/u as above.  Please see last note for details.  His dizziness has essentially resolved, but his nocturnal polyuria and elevated BP remain a problem.  I'm sorry to hear that he isn't checking his BP at home.  He follows a low salt diet.  He tries to minimize his evening fluids.  Urinary stream did not improve after this thiazide was discontinued.  He also casually mentions mild left heel pain that is especially bad in the morning and then eases off later.  No h/o trauma.    Past Medical History:  Diagnosis Date   Hepatocellular carcinoma (HCC) 03/05/2021   f/by [facility]   Hypercholesteremia    Hypertension    Nephrolithiasis 07/21/2013   Osteoarthritis    Prostatic hypertrophy    Type 2 diabetes mellitus [facility])     Outpatient Encounter Medications as of 01/10/2024  Medication Sig   amLODipine  (NORVASC ) 5 MG tablet Take 1 tablet (5 mg total) by mouth daily.   lisinopril  (ZESTRIL ) 10 MG tablet Take 1 tablet (10 mg total) by mouth daily.   lisinopril  (ZESTRIL ) 20 MG tablet Take 1 tablet (20 mg total) by mouth daily.   metFORMIN (GLUCOPHAGE) 1000 MG tablet Take 1,000 mg by mouth 2 (two) times daily with a meal.   polyethylene glycol-electrolytes (NULYTELY) 420 g solution Take by mouth.   tamsulosin (FLOMAX) 0.4 MG CAPS capsule Take 0.4 mg by mouth daily.   lisinopril  (ZESTRIL ) 20 MG tablet Take 40 mg by mouth in the morning. (Patient not taking: Reported on 12/27/2023)   No facility-administered encounter medications on file as of 01/10/2024.    Social History   Tobacco Use   Smoking status: Never   Smokeless tobacco: Never  Vaping Use   Vaping status: Never Used  Substance Use Topics   Alcohol use: Never   Drug use: Never      Review of Systems  Constitutional:   Negative for diaphoresis, fever, malaise/fatigue and weight loss.  Respiratory:  Negative for cough, shortness of breath and wheezing.   Cardiovascular:  Negative for chest pain, palpitations, orthopnea, claudication, leg swelling and PND.      Objective:     BP (!) 175/79 (BP Location: Right Arm, Patient Position: Standing, Cuff Size: Normal)   Pulse 79   Temp (!) 97.5 F (36.4 C) (Oral)   Resp 16   Wt 152 lb 12.8 oz (69.3 kg)   SpO2 96%   BMI 25.15 kg/m    Physical Exam Constitutional:      General: He is not in acute distress.    Appearance: Normal appearance.  HENT:     Head: Normocephalic.  Neck:     Vascular: No carotid bruit.  Cardiovascular:     Rate and Rhythm: Normal rate and regular rhythm.     Pulses: Normal pulses.     Heart sounds: Normal heart sounds.  Pulmonary:     Effort: Pulmonary effort is normal.     Breath sounds: Normal breath sounds.  Abdominal:     General: Bowel sounds are normal.     Palpations: Abdomen is soft.  Musculoskeletal:     Cervical back: Neck supple. No tenderness.     Right lower leg: No edema.     Left  lower leg: No edema.     Comments: Mild left heel tenderness.  Neurological:     Mental Status: He is alert.      No results found for any visits on 01/10/24.    The ASCVD Risk score (Arnett DK, et al., 2019) failed to calculate for the following reasons:   Cannot find a previous HDL lab   Cannot find a previous total cholesterol lab    Assessment & Plan:  Prostatic hypertrophy Assessment & Plan: Not improved off of the diuretic.  I advised him to increase his Tamsulosin to 2 pills at hs.  Continue to limit his evening fluids.  This should also help bring his BP down some.   Primary hypertension Assessment & Plan: Maintain Amlodipine .  Increase Lisinopril  to a total of 30mg  daily, as I'm not sure he can tolerate a higher dosage yet.  Advised to purchase a home BP cuff and update us  early next week.  Orders: -      Lisinopril ; Take 1 tablet (10 mg total) by mouth daily.  Dispense: 30 tablet; Refill: 3  Pain of left heel Assessment & Plan: Likely mild plantar fasciitis.  Comfortable footwear and Motrin prn.  May need stretches next week.   Encounter for immunization -     Flu vaccine HIGH DOSE PF(Fluzone Trivalent)  I personally spent a total of 25 minutes in the care of the patient today including performing a medically appropriate exam/evaluation, counseling and educating, documenting clinical information in the EHR, and communicating results.   Return in about 4 weeks (around 02/07/2024) for chronic follow-up.    REDDING PONCE NORLEEN FALCON., MD

## 2024-01-10 NOTE — Assessment & Plan Note (Addendum)
 Not improved off of the diuretic.  I advised him to increase his Tamsulosin to 2 pills at hs.  Continue to limit his evening fluids.  This should also help bring his BP down some.

## 2024-01-10 NOTE — Assessment & Plan Note (Signed)
 Likely mild plantar fasciitis.  Comfortable footwear and Motrin prn.  May need stretches next week.

## 2024-01-10 NOTE — Assessment & Plan Note (Signed)
 Maintain Amlodipine .  Increase Lisinopril  to a total of 30mg  daily, as I'm not sure he can tolerate a higher dosage yet.  Advised to purchase a home BP cuff and update us  early next week.

## 2024-01-10 NOTE — Progress Notes (Signed)
 Patient is in office today for a nurse visit for flu vaccine. Patient Injection was given in the  Right deltoid. Patient tolerated injection well.

## 2024-02-07 ENCOUNTER — Encounter (HOSPITAL_BASED_OUTPATIENT_CLINIC_OR_DEPARTMENT_OTHER): Payer: Self-pay | Admitting: Family Medicine

## 2024-02-07 ENCOUNTER — Ambulatory Visit (INDEPENDENT_AMBULATORY_CARE_PROVIDER_SITE_OTHER): Admitting: Family Medicine

## 2024-02-07 VITALS — BP 146/84 | HR 77 | Temp 97.4°F | Resp 16 | Wt 152.1 lb

## 2024-02-07 DIAGNOSIS — N400 Enlarged prostate without lower urinary tract symptoms: Secondary | ICD-10-CM

## 2024-02-07 DIAGNOSIS — I10 Essential (primary) hypertension: Secondary | ICD-10-CM | POA: Diagnosis not present

## 2024-02-07 DIAGNOSIS — D510 Vitamin B12 deficiency anemia due to intrinsic factor deficiency: Secondary | ICD-10-CM

## 2024-02-07 DIAGNOSIS — E785 Hyperlipidemia, unspecified: Secondary | ICD-10-CM

## 2024-02-07 DIAGNOSIS — E1165 Type 2 diabetes mellitus with hyperglycemia: Secondary | ICD-10-CM | POA: Diagnosis not present

## 2024-02-07 MED ORDER — AMLODIPINE BESYLATE 5 MG PO TABS: 5.0000 mg | ORAL_TABLET | Freq: Every day | ORAL | 1 refills | Status: AC

## 2024-02-07 MED ORDER — CYANOCOBALAMIN 1000 MCG/ML IJ SOLN
1000.0000 ug | Freq: Once | INTRAMUSCULAR | Status: AC
  Administered 2024-02-07: 1000 ug via INTRAMUSCULAR

## 2024-02-07 MED ORDER — LISINOPRIL 40 MG PO TABS: 40.0000 mg | ORAL_TABLET | Freq: Every morning | ORAL | 1 refills | Status: AC

## 2024-02-07 MED ORDER — TAMSULOSIN HCL 0.4 MG PO CAPS: 0.8000 mg | ORAL_CAPSULE | Freq: Every day | ORAL | 1 refills | Status: AC

## 2024-02-07 NOTE — Assessment & Plan Note (Signed)
 1800 ADA diet.  F/u with him soon after labs back. Orders:   Hemoglobin A1c   Basic metabolic panel with GFR

## 2024-02-07 NOTE — Assessment & Plan Note (Signed)
 PSA deferred for now based on his age and relatively mild symptoms.  Continue current dosage of tamsulosin, which is fairly effective in maintaining urinary flow. He reports nocturia twice per night, which is acceptable. Evening fluid intake should be limited. - Continue tamsulosin as prescribed. - Advised on reducing evening fluid intake to minimize nocturia. -If his symptoms worsen, I advised Urology referral to determine if further workup is indicated. Orders:   tamsulosin (FLOMAX) 0.4 MG CAPS capsule; Take 2 capsules (0.8 mg total) by mouth daily.

## 2024-02-07 NOTE — Progress Notes (Signed)
 "  Established Patient Office Visit  Subjective   Patient ID: Ronald Flores, male    DOB: 1950-03-07  Age: 74 y.o. MRN: [id number]  Chief Complaint  Patient presents with   Follow-up    Follow-up     Discussed the use of AI scribe software for clinical note transcription with the patient, who gave verbal consent to proceed.  History of Present Illness Ronald Flores is a 74 year old male with hypertension and mild persisting polyuria who presents for medication management and follow-up. He is accompanied by a family member who assists with medication management.  He takes two tamsulosin at bedtime, and gets up twice to urinate.  He drinks a lot of water during the day and has been advised to cut down on fluids in the evening.  He recently underwent a colonoscopy and EGD.  Has since noted mild occasionally dysphagia.  No obvious heartburn.  He agrees to discuss this with his gastroenterologist.  He is on amlodipine  5 mg daily for hypertension and lisinopril . He eats out frequently, which may contribute to higher sodium intake, potentially affecting his blood pressure. No recent cholesterol checks. He reports no recent consumption of fried foods, French fries, or desserts, except for a donut at breakfast.   He has not been getting regular B12 shots, and we discussed the need to make this a regular event.    Past Medical History:  Diagnosis Date   Hepatocellular carcinoma (HCC) 03/05/2021   f/by [facility]   Hypercholesteremia    Hypertension    Nephrolithiasis 07/21/2013   Osteoarthritis    Pernicious anemia    Prostatic hypertrophy    Type 2 diabetes mellitus [facility])     Outpatient Encounter Medications as of 02/07/2024  Medication Sig   metFORMIN (GLUCOPHAGE) 1000 MG tablet Take 1,000 mg by mouth 2 (two) times daily with a meal.   polyethylene glycol-electrolytes (NULYTELY) 420 g solution Take by mouth.   [DISCONTINUED] amLODipine  (NORVASC ) 5 MG tablet Take 1 tablet (5 mg total)  by mouth daily.   [DISCONTINUED] lisinopril  (ZESTRIL ) 10 MG tablet Take 1 tablet (10 mg total) by mouth daily.   [DISCONTINUED] lisinopril  (ZESTRIL ) 20 MG tablet Take 1 tablet (20 mg total) by mouth daily.   [DISCONTINUED] tamsulosin (FLOMAX) 0.4 MG CAPS capsule Take 0.4 mg by mouth daily.   amLODipine  (NORVASC ) 5 MG tablet Take 1 tablet (5 mg total) by mouth daily.   lisinopril  (ZESTRIL ) 40 MG tablet Take 1 tablet (40 mg total) by mouth in the morning.   tamsulosin (FLOMAX) 0.4 MG CAPS capsule Take 2 capsules (0.8 mg total) by mouth daily.   [DISCONTINUED] lisinopril  (ZESTRIL ) 20 MG tablet Take 40 mg by mouth in the morning. (Patient not taking: Reported on 12/27/2023)   [EXPIRED] cyanocobalamin  (VITAMIN B12) injection 1,000 mcg    No facility-administered encounter medications on file as of 02/07/2024.    Social History   Tobacco Use   Smoking status: Never   Smokeless tobacco: Never  Vaping Use   Vaping status: Never Used  Substance Use Topics   Alcohol use: Never   Drug use: Never      Review of Systems  Constitutional:  Negative for diaphoresis, fever, malaise/fatigue and weight loss.  Respiratory:  Negative for cough, shortness of breath and wheezing.   Cardiovascular:  Negative for chest pain, palpitations, orthopnea, claudication, leg swelling and PND.      Objective:     BP (!) 146/84 (BP Location: Right Arm, Patient Position: Standing)  Pulse 77   Temp (!) 97.4 F (36.3 C) (Oral)   Resp 16   Wt 152 lb 1.6 oz (69 kg)   SpO2 97%   BMI 25.04 kg/m    Physical Exam Constitutional:      General: He is not in acute distress.    Appearance: Normal appearance.  HENT:     Head: Normocephalic.  Neck:     Vascular: No carotid bruit.  Cardiovascular:     Rate and Rhythm: Normal rate and regular rhythm.     Pulses: Normal pulses.     Heart sounds: Normal heart sounds.  Pulmonary:     Effort: Pulmonary effort is normal.     Breath sounds: Normal breath sounds.   Abdominal:     General: Bowel sounds are normal.     Palpations: Abdomen is soft.  Musculoskeletal:     Cervical back: Neck supple. No tenderness.     Right lower leg: No edema.     Left lower leg: No edema.  Neurological:     Mental Status: He is alert.      No results found for any visits on 02/07/24.    The ASCVD Risk score (Arnett DK, et al., 2019) failed to calculate for the following reasons:   Cannot find a previous HDL lab   Cannot find a previous total cholesterol lab   * - Cholesterol units were assumed    Assessment & Plan:   Assessment & Plan Prostatic hypertrophy PSA deferred for now based on his age and relatively mild symptoms.  Continue current dosage of tamsulosin, which is fairly effective in maintaining urinary flow. He reports nocturia twice per night, which is acceptable. Evening fluid intake should be limited. - Continue tamsulosin as prescribed. - Advised on reducing evening fluid intake to minimize nocturia. -If his symptoms worsen, I advised Urology referral to determine if further workup is indicated. Orders:   tamsulosin (FLOMAX) 0.4 MG CAPS capsule; Take 2 capsules (0.8 mg total) by mouth daily.  Primary hypertension Hypertension management requires adjustment due to suboptimal control.  - Advised on reducing dietary sodium intake, especially from restaurant meals. - Recommended purchasing a digital arm blood pressure cuff for home monitoring, while standing. Orders:   lisinopril  (ZESTRIL ) 40 MG tablet; Take 1 tablet (40 mg total) by mouth in the morning.   amLODipine  (NORVASC ) 5 MG tablet; Take 1 tablet (5 mg total) by mouth daily.  Type 2 diabetes mellitus with hyperglycemia, without long-term current use of insulin (HCC) 1800 ADA diet.  F/u with him soon after labs back. Orders:   Hemoglobin A1c   Basic metabolic panel with GFR  Pernicious anemia Requiring ongoing management. He has received one B12 injection but requires regular  supplementation. Oral B12 supplements are sometimes less effective and require monitoring. Regular B12 injections are necessary to prevent memory loss and fatigue. - Administered B12 injection today. - Schedule weekly B12 injections for four weeks, then monthly injections. - Discuss with front desk staff about setting up B12 injection schedule.  Orders:   cyanocobalamin  (VITAMIN B12) injection 1,000 mcg  Dyslipidemia  Orders:   Lipid panel     Return in about 3 months (around 05/07/2024) for chronic follow-up.    REDDING PONCE NORLEEN FALCON., MD "

## 2024-02-07 NOTE — Assessment & Plan Note (Signed)
 Hypertension management requires adjustment due to suboptimal control.  - Advised on reducing dietary sodium intake, especially from restaurant meals. - Recommended purchasing a digital arm blood pressure cuff for home monitoring, while standing. Orders:   lisinopril  (ZESTRIL ) 40 MG tablet; Take 1 tablet (40 mg total) by mouth in the morning.   amLODipine  (NORVASC ) 5 MG tablet; Take 1 tablet (5 mg total) by mouth daily.

## 2024-02-07 NOTE — Assessment & Plan Note (Signed)
 Requiring ongoing management. He has received one B12 injection but requires regular supplementation. Oral B12 supplements are sometimes less effective and require monitoring. Regular B12 injections are necessary to prevent memory loss and fatigue. - Administered B12 injection today. - Schedule weekly B12 injections for four weeks, then monthly injections. - Discuss with front desk staff about setting up B12 injection schedule.  Orders:   cyanocobalamin  (VITAMIN B12) injection 1,000 mcg

## 2024-02-07 NOTE — Progress Notes (Signed)
 Patient is in office today for a nurse visit for B12 Injection. Patient Injection was given in the  Right deltoid. Patient tolerated injection well.

## 2024-02-08 LAB — BASIC METABOLIC PANEL WITH GFR
BUN/Creatinine Ratio: 14 (ref 10–24)
BUN: 19 mg/dL (ref 8–27)
CO2: 25 mmol/L (ref 20–29)
Calcium: 9.7 mg/dL (ref 8.6–10.2)
Chloride: 98 mmol/L (ref 96–106)
Creatinine, Ser: 1.36 mg/dL — ABNORMAL HIGH (ref 0.76–1.27)
Glucose: 264 mg/dL — ABNORMAL HIGH (ref 70–99)
Potassium: 5.4 mmol/L — ABNORMAL HIGH (ref 3.5–5.2)
Sodium: 135 mmol/L (ref 134–144)
eGFR: 55 mL/min/1.73 — ABNORMAL LOW

## 2024-02-08 LAB — LIPID PANEL
Chol/HDL Ratio: 2.8 ratio (ref 0.0–5.0)
Cholesterol, Total: 177 mg/dL (ref 100–199)
HDL: 64 mg/dL
LDL Chol Calc (NIH): 84 mg/dL (ref 0–99)
Triglycerides: 174 mg/dL — ABNORMAL HIGH (ref 0–149)
VLDL Cholesterol Cal: 29 mg/dL (ref 5–40)

## 2024-02-08 LAB — HEMOGLOBIN A1C
Est. average glucose Bld gHb Est-mCnc: 321 mg/dL
Hgb A1c MFr Bld: 12.8 % — ABNORMAL HIGH (ref 4.8–5.6)

## 2024-02-10 ENCOUNTER — Ambulatory Visit (HOSPITAL_BASED_OUTPATIENT_CLINIC_OR_DEPARTMENT_OTHER): Payer: Self-pay | Admitting: Family Medicine

## 2024-02-14 ENCOUNTER — Ambulatory Visit (INDEPENDENT_AMBULATORY_CARE_PROVIDER_SITE_OTHER)

## 2024-02-14 DIAGNOSIS — D510 Vitamin B12 deficiency anemia due to intrinsic factor deficiency: Secondary | ICD-10-CM

## 2024-02-14 MED ORDER — CYANOCOBALAMIN 1000 MCG/ML IJ SOLN
1000.0000 ug | Freq: Once | INTRAMUSCULAR | Status: AC
  Administered 2024-02-14: 1000 ug via INTRAMUSCULAR

## 2024-02-14 NOTE — Progress Notes (Unsigned)
 Patient is in office today for a nurse visit for B12 Injection. Patient Injection was given in the  Right deltoid. Patient tolerated injection well.

## 2024-02-20 ENCOUNTER — Ambulatory Visit (INDEPENDENT_AMBULATORY_CARE_PROVIDER_SITE_OTHER)

## 2024-02-20 DIAGNOSIS — D510 Vitamin B12 deficiency anemia due to intrinsic factor deficiency: Secondary | ICD-10-CM

## 2024-02-20 MED ORDER — CYANOCOBALAMIN 1000 MCG/ML IJ SOLN
1000.0000 ug | Freq: Once | INTRAMUSCULAR | Status: AC
  Administered 2024-02-20: 1000 ug via INTRAMUSCULAR

## 2024-02-20 NOTE — Progress Notes (Signed)
 Patient is in office today for a nurse visit for B12 Injection. Patient Injection was given in the  Right deltoid. Patient tolerated injection well.

## 2024-02-24 ENCOUNTER — Ambulatory Visit (INDEPENDENT_AMBULATORY_CARE_PROVIDER_SITE_OTHER): Admitting: Family Medicine

## 2024-02-24 ENCOUNTER — Encounter (HOSPITAL_BASED_OUTPATIENT_CLINIC_OR_DEPARTMENT_OTHER): Payer: Self-pay | Admitting: Family Medicine

## 2024-02-24 VITALS — BP 128/78 | HR 77 | Temp 98.3°F | Resp 16 | Wt 148.7 lb

## 2024-02-24 DIAGNOSIS — I10 Essential (primary) hypertension: Secondary | ICD-10-CM | POA: Diagnosis not present

## 2024-02-24 DIAGNOSIS — E785 Hyperlipidemia, unspecified: Secondary | ICD-10-CM | POA: Diagnosis not present

## 2024-02-24 DIAGNOSIS — N1831 Chronic kidney disease, stage 3a: Secondary | ICD-10-CM | POA: Diagnosis not present

## 2024-02-24 DIAGNOSIS — E1165 Type 2 diabetes mellitus with hyperglycemia: Secondary | ICD-10-CM | POA: Insufficient documentation

## 2024-02-24 MED ORDER — EMPAGLIFLOZIN 10 MG PO TABS: 10.0000 mg | ORAL_TABLET | Freq: Every day | ORAL | 1 refills | Status: AC

## 2024-02-24 MED ORDER — GLIMEPIRIDE 2 MG PO TABS: 2.0000 mg | ORAL_TABLET | Freq: Every day | ORAL | 3 refills | Status: AC

## 2024-02-24 NOTE — Assessment & Plan Note (Signed)
 Ronald Flores

## 2024-02-24 NOTE — Progress Notes (Unsigned)
 "  Established Patient Office Visit  Subjective   Patient ID: Ronald Flores, male    DOB: 09/08/1950  Age: 74 y.o. MRN: [id number]  Chief Complaint  Patient presents with   Follow-up    Follow-up    Discussed the use of AI scribe software for clinical note transcription with the patient, who gave verbal consent to proceed.  History of Present Illness Ronald Flores is a 74 year old male with diabetes and hypertension who presents for medication management and follow-up. He is accompanied by his wife.  He was taken off metformin due to concerns about kidney function.  He experienced a single episode of dizziness with room spinning last Sunday night, which resolved quickly and has not recurred. He is not currently monitoring his blood pressure at home.  He has a history of diabetes and was previously on Ozempic and Bydureon, but discontinued Ozempic due to cost.  His recent blood sugar levels have been poor. He enjoys donuts and cakes but is trying to cut back on the carbs.  He drinks a lot of water, especially during the day.  He avoids ibuprofen and similar medications to prevent further kidney damage and is currently taking Tylenol  as needed.  He has difficulty monitoring his blood sugar levels due to calluses on his fingers. His wife assists him with this task.  He is active and stays busy, which he believes helps manage his health. He drinks water primarily, with occasional diet soda, and avoids liquid sugars. He urinates normally at night and he does not consume much salt, avoiding salty snacks.    Past Medical History:  Diagnosis Date   Hepatocellular carcinoma (HCC) 03/05/2021   f/by [facility]   Hypercholesteremia    Hypertension    Nephrolithiasis 07/21/2013   Osteoarthritis    Pernicious anemia    Prostatic hypertrophy    Type 2 diabetes mellitus [facility])     Outpatient Encounter Medications as of 02/24/2024  Medication Sig   amLODipine  (NORVASC ) 5 MG tablet Take 1  tablet (5 mg total) by mouth daily.   empagliflozin  (JARDIANCE ) 10 MG TABS tablet Take 1 tablet (10 mg total) by mouth daily before breakfast.   glimepiride  (AMARYL ) 2 MG tablet Take 1 tablet (2 mg total) by mouth daily before breakfast.   lisinopril  (ZESTRIL ) 40 MG tablet Take 1 tablet (40 mg total) by mouth in the morning.   tamsulosin  (FLOMAX ) 0.4 MG CAPS capsule Take 2 capsules (0.8 mg total) by mouth daily.   metFORMIN (GLUCOPHAGE) 1000 MG tablet Take 1,000 mg by mouth 2 (two) times daily with a meal. (Patient not taking: Reported on 02/24/2024)   polyethylene glycol-electrolytes (NULYTELY) 420 g solution Take by mouth. (Patient not taking: Reported on 02/24/2024)   No facility-administered encounter medications on file as of 02/24/2024.    Social History[1]    Review of Systems  Constitutional:  Negative for diaphoresis, fever, malaise/fatigue and weight loss.  Respiratory:  Negative for cough, shortness of breath and wheezing.   Cardiovascular:  Negative for chest pain, palpitations, orthopnea, claudication, leg swelling and PND.      Objective:     BP 128/78 (BP Location: Right Arm, Patient Position: Standing, Cuff Size: Normal)   Pulse 77   Temp 98.3 F (36.8 C) (Oral)   Resp 16   Wt 148 lb 11.2 oz (67.4 kg)   SpO2 96%   BMI 24.48 kg/m    Physical Exam Constitutional:      General: He is not in  acute distress.    Appearance: Normal appearance.  HENT:     Head: Normocephalic.  Neck:     Vascular: No carotid bruit.  Cardiovascular:     Rate and Rhythm: Normal rate and regular rhythm.     Pulses: Normal pulses.     Heart sounds: Normal heart sounds.  Pulmonary:     Effort: Pulmonary effort is normal.     Breath sounds: Normal breath sounds.  Abdominal:     General: Bowel sounds are normal.     Palpations: Abdomen is soft.  Musculoskeletal:     Cervical back: Neck supple. No tenderness.     Right lower leg: No edema.     Left lower leg: No edema.   Neurological:     Mental Status: He is alert.      No results found for any visits on 02/24/24.    The 10-year ASCVD risk score (Arnett DK, et al., 2019) is: 40.7%    Assessment & Plan:   Assessment & Plan Uncontrolled type 2 diabetes mellitus with hyperglycemia (HCC) Recent poor glycemic control. Previously on metformin, Ozempic, and Bydureon. Metformin discontinued due to potential nephrotoxicity. Ozempic was effective but unaffordable. Considering Jardiance  as ideal but may be costly. Glimepiride  as a cost-effective alternative. Emphasis on dietary modifications to manage blood sugar levels. - Prescribed Jardiance  and glimepiride . Instructed to check with pharmacist for Jardiance  cost. - Advised to monitor blood sugar three times a week, including fasting and postprandial readings. - Encouraged dietary modifications: avoid sweet tea, sodas, milkshakes, and fruit juices. Encouraged water, unsweetened tea, diet soda, and reduced sodium snacks. - Discussed potential use of continuous glucose monitor if affordable. Orders:   empagliflozin  (JARDIANCE ) 10 MG TABS tablet; Take 1 tablet (10 mg total) by mouth daily before breakfast.   glimepiride  (AMARYL ) 2 MG tablet; Take 1 tablet (2 mg total) by mouth daily before breakfast.  CKD stage 3a, GFR 45-59 ml/min (HCC) Chronic kidney disease stage 3a. Metformin discontinued due to potential nephrotoxicity. Advised to avoid NSAIDs to prevent further kidney damage. Emphasis on managing diabetes and blood pressure to protect kidney function. - Continue to avoid NSAIDs, including ibuprofen, Advil, and Aleve. - Continue to monitor kidney function and adjust diabetes and blood pressure management as needed.    Primary hypertension DASH diet.  Nicely controlled.  Continue Zestril  and Amlodipine .    Dyslipidemia Unclear why he isn't on a statin and will review this soon.       Return in about 3 months (around 05/24/2024) for chronic  follow-up.    REDDING PONCE NORLEEN FALCON., MD     [1]  Social History Tobacco Use   Smoking status: Never   Smokeless tobacco: Never  Vaping Use   Vaping status: Never Used  Substance Use Topics   Alcohol use: Never   Drug use: Never   "

## 2024-02-24 NOTE — Assessment & Plan Note (Signed)
 Recent poor glycemic control. Previously on metformin, Ozempic, and Bydureon. Metformin discontinued due to potential nephrotoxicity. Ozempic was effective but unaffordable. Considering Jardiance  as ideal but may be costly. Glimepiride  as a cost-effective alternative. Emphasis on dietary modifications to manage blood sugar levels. - Prescribed Jardiance  and glimepiride . Instructed to check with pharmacist for Jardiance  cost. - Advised to monitor blood sugar three times a week, including fasting and postprandial readings. - Encouraged dietary modifications: avoid sweet tea, sodas, milkshakes, and fruit juices. Encouraged water, unsweetened tea, diet soda, and reduced sodium snacks. - Discussed potential use of continuous glucose monitor if affordable. Orders:   empagliflozin  (JARDIANCE ) 10 MG TABS tablet; Take 1 tablet (10 mg total) by mouth daily before breakfast.   glimepiride  (AMARYL ) 2 MG tablet; Take 1 tablet (2 mg total) by mouth daily before breakfast.

## 2024-02-25 ENCOUNTER — Telehealth (HOSPITAL_BASED_OUTPATIENT_CLINIC_OR_DEPARTMENT_OTHER): Payer: Self-pay | Admitting: *Deleted

## 2024-02-25 ENCOUNTER — Encounter (HOSPITAL_BASED_OUTPATIENT_CLINIC_OR_DEPARTMENT_OTHER): Payer: Self-pay | Admitting: Family Medicine

## 2024-02-25 DIAGNOSIS — E785 Hyperlipidemia, unspecified: Secondary | ICD-10-CM | POA: Insufficient documentation

## 2024-02-25 NOTE — Telephone Encounter (Signed)
 Copied from CRM [id number]. Topic: Clinical - Medication Question >> Feb 25, 2024  9:27 AM Ronald Flores wrote: Reason for CRM: Cathlean, the pt wife is calling to inform Dr. Dottie that her husband would like to start taking Jardiance . She spoke w/ the insurance provider and was informed that the first three months will be expensive. After the initial three months, the cost will be $36.54

## 2024-02-25 NOTE — Assessment & Plan Note (Signed)
 Unclear why he isn't on a statin and will review this soon.

## 2024-02-28 ENCOUNTER — Ambulatory Visit (HOSPITAL_BASED_OUTPATIENT_CLINIC_OR_DEPARTMENT_OTHER)

## 2024-03-04 ENCOUNTER — Telehealth (HOSPITAL_BASED_OUTPATIENT_CLINIC_OR_DEPARTMENT_OTHER): Payer: Self-pay | Admitting: Family Medicine

## 2024-03-04 NOTE — Telephone Encounter (Signed)
 Copied from CRM #[id number]. Topic: General - Other >> Mar 04, 2024  2:04 PM Donna BRAVO wrote: Reason for CRM: Patient wife asking to speak with Darice.   Cathlean would like to speak with Darice Shed [phone]  Patient was informed they will receive a call back by end of business day  I spoke with Gwenn and she states she was unable to speak when the office call. She thought is was Darice but no one by this name at the clinic. She is aware the clinic closed at 12pm today and will wait to hear back from Annieah to see if Dr Dottie needed anything or was he checking on Jardiance .

## 2024-04-16 ENCOUNTER — Other Ambulatory Visit

## 2024-04-16 ENCOUNTER — Other Ambulatory Visit (HOSPITAL_BASED_OUTPATIENT_CLINIC_OR_DEPARTMENT_OTHER): Admitting: Radiology

## 2024-04-17 ENCOUNTER — Ambulatory Visit: Admitting: Oncology

## 2024-05-25 ENCOUNTER — Ambulatory Visit (HOSPITAL_BASED_OUTPATIENT_CLINIC_OR_DEPARTMENT_OTHER): Admitting: Family Medicine
